# Patient Record
Sex: Female | Born: 1980 | Race: White | Hispanic: No | Marital: Married | Smoking: Never smoker
Health system: Southern US, Community
[De-identification: ages and names within clinical notes are randomized; demographics above are authoritative.]

## PROBLEM LIST (undated history)

## (undated) ENCOUNTER — Inpatient Hospital Stay (HOSPITAL_COMMUNITY): Payer: Self-pay

## (undated) DIAGNOSIS — R51 Headache: Secondary | ICD-10-CM

## (undated) DIAGNOSIS — Z6791 Unspecified blood type, Rh negative: Secondary | ICD-10-CM

## (undated) DIAGNOSIS — Z8619 Personal history of other infectious and parasitic diseases: Secondary | ICD-10-CM

## (undated) DIAGNOSIS — F419 Anxiety disorder, unspecified: Secondary | ICD-10-CM

## (undated) DIAGNOSIS — I8290 Acute embolism and thrombosis of unspecified vein: Secondary | ICD-10-CM

## (undated) DIAGNOSIS — R87629 Unspecified abnormal cytological findings in specimens from vagina: Secondary | ICD-10-CM

## (undated) DIAGNOSIS — E282 Polycystic ovarian syndrome: Secondary | ICD-10-CM

## (undated) DIAGNOSIS — O24419 Gestational diabetes mellitus in pregnancy, unspecified control: Secondary | ICD-10-CM

## (undated) DIAGNOSIS — O26899 Other specified pregnancy related conditions, unspecified trimester: Secondary | ICD-10-CM

## (undated) HISTORY — DX: Personal history of other infectious and parasitic diseases: Z86.19

## (undated) HISTORY — PX: OTHER SURGICAL HISTORY: SHX169

## (undated) HISTORY — DX: Unspecified abnormal cytological findings in specimens from vagina: R87.629

## (undated) HISTORY — DX: Anxiety disorder, unspecified: F41.9

## (undated) HISTORY — DX: Headache: R51

## (undated) HISTORY — DX: Acute embolism and thrombosis of unspecified vein: I82.90

## (undated) HISTORY — DX: Polycystic ovarian syndrome: E28.2

---

## 2001-03-12 DIAGNOSIS — Z8619 Personal history of other infectious and parasitic diseases: Secondary | ICD-10-CM

## 2001-03-12 HISTORY — DX: Personal history of other infectious and parasitic diseases: Z86.19

## 2001-03-12 HISTORY — PX: GYNECOLOGIC CRYOSURGERY: SHX857

## 2004-05-25 ENCOUNTER — Encounter: Admission: RE | Admit: 2004-05-25 | Discharge: 2004-05-25 | Payer: Self-pay | Admitting: Family Medicine

## 2011-01-12 ENCOUNTER — Ambulatory Visit (INDEPENDENT_AMBULATORY_CARE_PROVIDER_SITE_OTHER): Payer: Self-pay | Admitting: Obstetrics and Gynecology

## 2011-01-12 ENCOUNTER — Encounter: Payer: Self-pay | Admitting: Obstetrics and Gynecology

## 2011-01-12 VITALS — BP 129/93 | HR 96 | Temp 98.9°F | Ht 66.0 in | Wt 232.7 lb

## 2011-01-12 DIAGNOSIS — N898 Other specified noninflammatory disorders of vagina: Secondary | ICD-10-CM

## 2011-01-12 DIAGNOSIS — Z01812 Encounter for preprocedural laboratory examination: Secondary | ICD-10-CM

## 2011-01-12 DIAGNOSIS — N939 Abnormal uterine and vaginal bleeding, unspecified: Secondary | ICD-10-CM

## 2011-01-12 LAB — CBC
MCHC: 35.2 g/dL (ref 30.0–36.0)
Platelets: 269 10*3/uL (ref 150–400)
RDW: 12.9 % (ref 11.5–15.5)

## 2011-01-12 NOTE — Progress Notes (Signed)
S: Pt presents today c/o irregular vag bleeding since 08/2010. She states she thinks she had a miscarriage at that time. She had a positive home preg test but started to have bleeding and she states her "preg test went to negative" very quickly after the bleeding began. Since that time, she has had irregular menses. She is currently bleeding and she declines examination. She denies abd pain or any other sx. O: VSS afebrile A&Ox3 in NAD A/P: Irregular vag bleeding: pt agreed to have CBC and TSH drawn today. She will return in 2 months when she is not on her menses to have a yearly pap and PE. Discussed diet, activity, risks, and precautions.  Clinton Gallant. Kadan Millstein III, DrHSc, MPAS, PA-C

## 2011-01-24 ENCOUNTER — Telehealth: Payer: Self-pay | Admitting: *Deleted

## 2011-01-24 NOTE — Telephone Encounter (Signed)
Patient called today and left a message on voicemail requesting results  Of blood tests from 01/12/11 visit

## 2011-01-24 NOTE — Telephone Encounter (Signed)
Called patient and left a message we are returning your call- please call today or tomorrow during office hours( per chart review upt negative, tsh normal- should have scheduled annual for January

## 2011-01-24 NOTE — Telephone Encounter (Signed)
Also cbc wnl

## 2011-01-26 NOTE — Telephone Encounter (Signed)
Pt left additional message that she wants her test results and we may leave a message on her voice mail. I called back and left message of all her test results: UPT- neg, TSH-wnl, CBC-wnl. I also stated that she should call back to the appt scheduling line after Thanksgiving for an appt for annual GYN exam.

## 2011-03-02 ENCOUNTER — Ambulatory Visit: Payer: Self-pay | Admitting: Physician Assistant

## 2011-03-02 DIAGNOSIS — F411 Generalized anxiety disorder: Secondary | ICD-10-CM

## 2011-04-04 ENCOUNTER — Ambulatory Visit (INDEPENDENT_AMBULATORY_CARE_PROVIDER_SITE_OTHER): Payer: PRIVATE HEALTH INSURANCE | Admitting: Gynecology

## 2011-04-04 ENCOUNTER — Other Ambulatory Visit (HOSPITAL_COMMUNITY)
Admission: RE | Admit: 2011-04-04 | Discharge: 2011-04-04 | Disposition: A | Discharge status: Home / Self Care | Payer: PRIVATE HEALTH INSURANCE | Source: Ambulatory Visit | Attending: Gynecology | Admitting: Gynecology

## 2011-04-04 ENCOUNTER — Encounter: Payer: Self-pay | Admitting: Gynecology

## 2011-04-04 VITALS — BP 124/78 | Ht 67.0 in | Wt 232.0 lb

## 2011-04-04 DIAGNOSIS — N970 Female infertility associated with anovulation: Secondary | ICD-10-CM | POA: Insufficient documentation

## 2011-04-04 DIAGNOSIS — Z01419 Encounter for gynecological examination (general) (routine) without abnormal findings: Secondary | ICD-10-CM

## 2011-04-04 DIAGNOSIS — E663 Overweight: Secondary | ICD-10-CM

## 2011-04-04 DIAGNOSIS — N915 Oligomenorrhea, unspecified: Secondary | ICD-10-CM

## 2011-04-04 DIAGNOSIS — E282 Polycystic ovarian syndrome: Secondary | ICD-10-CM

## 2011-04-04 LAB — URINALYSIS W MICROSCOPIC + REFLEX CULTURE
Bilirubin Urine: NEGATIVE
Glucose, UA: NEGATIVE mg/dL
Urobilinogen, UA: 0.2 mg/dL (ref 0.0–1.0)

## 2011-04-04 MED ORDER — DOXYCYCLINE HYCLATE 50 MG PO CAPS: 100.0000 mg | ORAL_CAPSULE | Freq: Two times a day (BID) | ORAL | Status: AC

## 2011-04-04 NOTE — Patient Instructions (Addendum)
Please call Sherrilyn Rist at [phone] to schedule HSG at hospital and semen analysis when your period starts.Then will also need appointment to me 1 week after tests to discuss results. Take antibiotic twice a day starting the days prior to the HSG.    Infertility WHAT IS INFERTILITY?  Infertility is usually defined as not being able to get pregnant after trying for one year of regular sexual intercourse without the use of contraceptives. Or not being able to carry a pregnancy to term and have a baby. The infertility rate in the [place] is around 10%. Pregnancy is the result of a chain of events. A woman must release an egg from one of her ovaries (ovulation). The egg must be fertilized by the female sperm. Then it travels through a fallopian tube into the uterus (womb), where it attaches to the wall of the uterus and grows. A man must have enough sperm, and the sperm must join with (fertilize) the egg along the way, at the proper time. The fertilized egg must then become attached to the inside of the uterus. While this may seem simple, many things can happen to prevent pregnancy from occurring.  WHOSE PROBLEM IS IT?  About 20% of infertility cases are due to problems with the man (female factors) and 65% are due to problems with the woman (female factors). Other cases are due to a combination of female and female factors or to unknown causes.  WHAT CAUSES INFERTILITY IN MEN?  Infertility in men is often caused by problems with making enough normal sperm or getting the sperm to reach the egg. Problems with sperm may exist from birth or develop later in life, due to illness or injury. Some men produce no sperm, or produce too few sperm (oligospermia). Other problems include:  Sexual dysfunction.   Hormonal or endocrine problems.   Age. Female fertility decreases with age, but not at as young an age as female fertility.   Infection.   Congenital problems. Birth defect, such as absence of the tubes that carry  the sperm (vas deferens).   Genetic/chromosomal problems.   Antisperm antibody problems.   Retrograde ejaculation (sperm go into the bladder).   Varicoceles, spematoceles, or tumors of the testicles.   Lifestyle can influence the number and quality of a man's sperm.   Alcohol and drugs can temporarily reduce sperm quality.   Environmental toxins, including pesticides and lead, may cause some cases of infertility in men.  WHAT CAUSES INFERTILITY IN WOMEN?   Problems with ovulation account for most infertility in women. Without ovulation, eggs are not available to be fertilized.   Signs of problems with ovulation include irregular menstrual periods or no periods at all.   Simple lifestyle factors, including stress, diet, or athletic training, can affect a woman's hormonal balance.   Age. Fertility begins to decrease in women in the early 89s and is worse after age 43.   Much less often, a hormonal imbalance from a serious medical problem, such as a pituitary gland tumor, thyroid or other chronic medical disease, can cause ovulation problems.   Pelvic infections.   Polycystic ovary syndrome (increase in female hormones, unable to ovulate).   Alcohol or illegal drugs.   Environmental toxins, radiation, pesticides, and certain chemicals.   Aging is an important factor in female infertility.   The ability of a woman's ovaries to produce eggs declines with age, especially after age 19. About one third of couples where the woman is over 35 will have  problems with fertility.   By the time she reaches menopause when her monthly periods stop for good, a woman can no longer produce eggs or become pregnant.   Other problems can also lead to infertility in women. If the fallopian tubes are blocked at one or both ends, the egg cannot travel through the tubes into the uterus. Scar tissue (adhesions) in the pelvis may cause blocked tubes. This may result from pelvic inflammatory disease,  endometriosis, or surgery for an ectopic pregnancy (fertilized egg implanted outside the uterus) or any pelvic or abdominal surgery causing adhesions.   Fibroid tumors or polyps of the uterus.   Congenital (birth defect) abnormalities of the uterus.   Infection of the cervix (cervicitis).   Cervical stenosis (narrowing).   Abnormal cervical mucus.   Polycystic ovary syndrome.   Having sexual intercourse too often (every other day or 4 to 5 times a week).   Obesity.   Anorexia.   Poor nutrition.   Over exercising, with loss of body fat.   DES. Your mother received diethylstilbesterol hormone when pregnant with you.  HOW IS INFERTILITY TESTED?  If you have been trying to have a baby without success, you may want to seek medical help. You should not wait for one year of trying before seeing a health care provider if:  You are over 35.   You have reason to believe that there may be a fertility problem.  A medical evaluation may determine the reasons for a couple's infertility. Usually this process begins with:  Physical exams.   Medical histories of both partners.   Sexual histories of both partners.  If there is no obvious problem, like improperly timed intercourse or absence of ovulation, tests may be needed.   For a man, testing usually begins with tests of his semen to look at:   The number of sperm.   The shape of sperm.   Movement of his sperm.   Taking a complete medical and surgical history.   Physical examination.   Check for infection of the female reproductive organs.  Sometimes hormone tests are done.   For a woman, the first step in testing is to find out if she is ovulating each month. There are several ways to do this. For example, she can keep track of changes in her morning body temperature and in the texture of her cervical mucus. Another tool is a home ovulation test kit, which can be bought at drug or grocery stores.   Checks of ovulation can  also be done in the doctor's office, using blood tests for hormone levels or ultrasound tests of the ovaries. If the woman is ovulating, more tests will need to be done. Some common female tests include:   Hysterosalpingogram: An x-ray of the fallopian tubes and uterus after they are injected with dye. It shows if the tubes are open and shows the shape of the uterus.   Laparoscopy: An exam of the tubes and other female organs for disease. A lighted tube called a laparoscope is used to see inside the abdomen.   Endometrial biopsy: Sample of uterus tissue taken on the first day of the menstrual period, to see if the tissue indicates you are ovulating.   Transvaginal ultrasound: Examines the female organs.   Hysteroscopy: Uses a lighted tube to examine the cervix and inside the uterus, to see if there are any abnormalities inside the uterus.  TREATMENT  Depending on the test results, different treatments can be suggested.  The type of treatment depends on the cause. 85 to 90% of infertility cases are treated with drugs or surgery.   Various fertility drugs may be used for women with ovulation problems. It is important to talk with your caregiver about the drug to be used. You should understand the drug's benefits and side effects. Depending on the type of fertility drug and the dosage of the drug used, multiple births (twins or multiples) can occur in some women.   If needed, surgery can be done to repair damage to a woman's ovaries, fallopian tubes, cervix, or uterus.   Surgery or medical treatment for endometriosis or polycystic ovary syndrome. Sometimes a man has an infertility problem that can be corrected with medicine or by surgery.   Intrauterine insemination (IUI) of sperm, timed with ovulation.   Change in lifestyle, if that is the cause (lose weight, increase exercise, and stop smoking, drinking excessively, or taking illegal drugs).   Other types of surgery:   Removing growths inside  and on the uterus.   Removing scar tissue from inside of the uterus.   Fixing blocked tubes.   Removing scar tissue in the pelvis and around the female organs.  WHAT IS ASSISTED REPRODUCTIVE TECHNOLOGY (ART)?  Assisted reproductive technology (ART) is another form of special methods used to help infertile couples. ART involves handling both the woman's eggs and the man's sperm. Success rates vary and depend on many factors. ART can be expensive and time-consuming. But ART has made it possible for many couples to have children that otherwise would not have been conceived. Some methods are listed below:  In vitro fertilization (IVF). IVF is often used when a woman's fallopian tubes are blocked or when a man has low sperm counts. A drug is used to stimulate the ovaries to produce multiple eggs. Once mature, the eggs are removed and placed in a culture dish with the man's sperm for fertilization. After about 40 hours, the eggs are examined to see if they have become fertilized by the sperm and are dividing into cells. These fertilized eggs (embryos) are then placed in the woman's uterus. This bypasses the fallopian tubes.   Gamete intrafallopian transfer (GIFT) is similar to IVF, but used when the woman has at least one normal fallopian tube. Three to five eggs are placed in the fallopian tube, along with the man's sperm, for fertilization inside the woman's body.   Zygote intrafallopian transfer (ZIFT), also called tubal embryo transfer, combines IVF and GIFT. The eggs retrieved from the woman's ovaries are fertilized in the lab and placed in the fallopian tubes rather than in the uterus.   ART procedures sometimes involve the use of donor eggs (eggs from another woman) or previously frozen embryos. Donor eggs may be used if a woman has impaired ovaries or has a genetic disease that could be passed on to her baby.   When performing ART, you are at higher risk for resulting in multiple pregnancies,  twins, triplets or more.   Intracytoplasma sperm injection is a procedure that injects a single sperm into the egg to fertilize it.   Embryo transplant is a procedure that starts after growing an embryo in a special media (chemical solution) developed to keep the embryo alive for 2 to 5 days, and then transplanting it into the uterus.  In cases where a cause cannot be found and pregnancy does not occur, adoption may be a consideration. Document Released: 03/01/2003 Document Revised: 11/08/2010 Document Reviewed: 01/25/2009 Exit[employer] Patient  Information 2012 [place], [place].

## 2011-04-04 NOTE — Progress Notes (Signed)
Kerri [name] 01-10-1981 [id number]   History:    31 y.o.  for annual exam new patient to the practice. Patient is a gravida 1 para 0 AB 1 (chemical pregnancy?). Patient with history of infertility. Patient cycles range from 40-45 days. She has also had irregular bleeding as a result of her oligomenorrhea. She is overweight and currently weighs 232 pounds with a BMI of 36.34. Patient stated that she had a TSH and CBC done in November which was normal she had been followed by another provider here in the community. Records were reviewed. She has a history of abnormal Pap smear 2003 treated with cryotherapy. She suffers from varicose veins and there was question whether at the age of 46 she had a true DVT or not because on questioning she was never placed on any anticoagulation and was instructed to take an aspirin daily. Several years after that she had been on oral contraceptive pills for close to 15 years with no sequelae reported. Her followup Pap smears have been normal except 2 years ago she had an ASCUS negative HPV with followup Pap smear being negative. At her primary care physician office she had been placed on Celexa and Klonopin for depression and anxiety. Patient in frequently does her self breast examination.  Past medical history,surgical history, family history and social history were all reviewed and documented in the EPIC chart.  Gynecologic History Patient's last menstrual period was 02/10/2011. Contraception: none Last Pap: 2011. Results were: Normal Last mammogram: No prior study. Res not indicated/abn:[id number]}  Obstetric History OB History    Grav Para Term Preterm Abortions TAB SAB Ect Mult Living   1 0 0 0 1  1 0 0 0     # Outc Date GA Lbr Len/2nd Wgt Sex Del Anes PTL Lv   1 SAB                ROS:  Was performed and pertinent positives and negatives are included in the history.  Exam: chaperone present  BP 124/78  Ht 5\' 7"  (1.702 m)  Wt 232 lb (105.235 kg)  BMI 36.34  kg/m2  LMP 02/10/2011  Body mass index is 36.34 kg/(m^2).  General appearance : Well developed well nourished female. No acute distress HEENT: Neck supple, trachea midline, no carotid bruits, no thyroidmegaly Lungs: Clear to auscultation, no rhonchi or wheezes, or rib retractions  Heart: Regular rate and rhythm, no murmurs or gallops Breast:Examined in sitting and supine position were symmetrical in appearance, no palpable masses or tenderness,  no skin retraction, no nipple inversion, no nipple discharge, no skin discoloration, no axillary or supraclavicular lymphadenopathy Abdomen: no palpable masses or tenderness, no rebound or guarding Extremities: no edema or skin discoloration or tenderness  Pelvic:  Bartholin, Urethra, Skene Glands: Within normal limits             Vagina: No gross lesions or discharge  Cervix: No gross lesions or discharge  Uterus  difficult to assess due to patient's abdominal girth and vaginismus  Adnexa  difficult to assess due to patient's abdominal girth and vaginismus  Anus and perineum  normal   Rectovaginal  normal sphincter tone without palpated masses or tenderness             Hemoccult not done     Assessment/Plan:  31 y.o. 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 Patient with PCO S.-like appearance (overweight, oligomenorrhea, and constitutional hirsutism). Due to the difficult nature  of her pelvic exam today she will return back to the office next week for an ultrasound to better assess her uterus and adnexa. When she does come in next week for the ultrasound she will come in a fasting state so that we can do a fasting blood sugar along with fasting insulin and prolactin as well as DHEA, total testosterone, and 17 hydroxyprogesterone . When her period starts in a few weeks she will contact the office so that we can schedule an HSG and semen analysis. We have called in to her pharmacy the  Vibramycin for her to take prophylactically twice a day starting with the day before the procedure. We'll then have her return to the office a week after to discuss all the results and possibly consider ovulation induction medication. Literature information was provided today of infertility. She will continue her prenatal vitamins and was instructed that she start tapering off her Celexa and Klonopin. Pap smear was done today. All questions were answered we'll follow accordingly.    Ok Edwards MD, 4:27 PM 04/04/2011

## 2011-04-11 ENCOUNTER — Encounter: Payer: Self-pay | Admitting: *Deleted

## 2011-04-11 ENCOUNTER — Encounter: Payer: Self-pay | Admitting: Gynecology

## 2011-04-11 ENCOUNTER — Ambulatory Visit (INDEPENDENT_AMBULATORY_CARE_PROVIDER_SITE_OTHER): Payer: PRIVATE HEALTH INSURANCE | Admitting: Gynecology

## 2011-04-11 ENCOUNTER — Ambulatory Visit (INDEPENDENT_AMBULATORY_CARE_PROVIDER_SITE_OTHER): Payer: PRIVATE HEALTH INSURANCE

## 2011-04-11 VITALS — BP 128/82

## 2011-04-11 DIAGNOSIS — N920 Excessive and frequent menstruation with regular cycle: Secondary | ICD-10-CM

## 2011-04-11 DIAGNOSIS — E282 Polycystic ovarian syndrome: Secondary | ICD-10-CM

## 2011-04-11 DIAGNOSIS — N912 Amenorrhea, unspecified: Secondary | ICD-10-CM

## 2011-04-11 DIAGNOSIS — N970 Female infertility associated with anovulation: Secondary | ICD-10-CM

## 2011-04-11 DIAGNOSIS — N8300 Follicular cyst of ovary, unspecified side: Secondary | ICD-10-CM

## 2011-04-11 DIAGNOSIS — E663 Overweight: Secondary | ICD-10-CM

## 2011-04-11 DIAGNOSIS — N915 Oligomenorrhea, unspecified: Secondary | ICD-10-CM

## 2011-04-11 LAB — PREGNANCY, URINE: Preg Test, Ur: NEGATIVE

## 2011-04-11 MED ORDER — MEDROXYPROGESTERONE ACETATE 10 MG PO TABS: 10.0000 mg | ORAL_TABLET | Freq: Every day | ORAL | Status: DC

## 2011-04-11 NOTE — Patient Instructions (Addendum)
Please make sure you return in a fasting state to get your blood drawn. Take the provera starting today for 5-10 days to start your period. When your period starts please call the office at [phone] and go into Jennifer's voice mail so she can schedule your HSG. Remember to start the antibiotic the day prior to procedure. I will need to see you in consultation 1 week after HSG to discuss all the results and plan a course of management. Here is information to  Help you loose weight and stop smoking:  Smoking Cessation This document explains the best ways for you to quit smoking and new treatments to help. It lists new medicines that can double or triple your chances of quitting and quitting for good. It also considers ways to avoid relapses and concerns you may have about quitting, including weight gain. NICOTINE: A POWERFUL ADDICTION If you have tried to quit smoking, you know how hard it can be. It is hard because nicotine is a very addictive drug. For some people, it can be as addictive as heroin or cocaine. Usually, people make 2 or 3 tries, or more, before finally being able to quit. Each time you try to quit, you can learn about what helps and what hurts. Quitting takes hard work and a lot of effort, but you can quit smoking. QUITTING SMOKING IS ONE OF THE MOST IMPORTANT THINGS YOU WILL EVER DO.  You will live longer, feel better, and live better.   The impact on your body of quitting smoking is felt almost immediately:   Within 20 minutes, blood pressure decreases. Pulse returns to its normal level.   After 8 hours, carbon monoxide levels in the blood return to normal. Oxygen level increases.   After 24 hours, chance of heart attack starts to decrease. Breath, hair, and body stop smelling like smoke.   After 48 hours, damaged nerve endings begin to recover. Sense of taste and smell improve.   After 72 hours, the body is virtually free of nicotine. Bronchial tubes relax and breathing becomes  easier.   After 2 to 12 weeks, lungs can hold more air. Exercise becomes easier and circulation improves.   Quitting will reduce your risk of having a heart attack, stroke, cancer, or lung disease:   After 1 year, the risk of coronary heart disease is cut in half.   After 5 years, the risk of stroke falls to the same as a nonsmoker.   After 10 years, the risk of lung cancer is cut in half and the risk of other cancers decreases significantly.   After 15 years, the risk of coronary heart disease drops, usually to the level of a nonsmoker.   If you are pregnant, quitting smoking will improve your chances of having a healthy baby.   The people you live with, especially your children, will be healthier.   You will have extra money to spend on things other than cigarettes.  FIVE KEYS TO QUITTING Studies have shown that these 5 steps will help you quit smoking and quit for good. You have the best chances of quitting if you use them together: 1. Get ready.  2. Get support and encouragement.  3. Learn new skills and behaviors.  4. Get medicine to reduce your nicotine addiction and use it correctly.  5. Be prepared for relapse or difficult situations. Be determined to continue trying to quit, even if you do not succeed at first.  1. GET READY  Set a quit  date.   Change your environment.   Get rid of ALL cigarettes, ashtrays, matches, and lighters in your home, car, and place of work.   Do not let people smoke in your home.   Review your past attempts to quit. Think about what worked and what did not.   Once you quit, do not smoke. NOT EVEN A PUFF!  2. GET SUPPORT AND ENCOURAGEMENT Studies have shown that you have a better chance of being successful if you have help. You can get support in many ways.  Tell your family, friends, and coworkers that you are going to quit and need their support. Ask them not to smoke around you.   Talk to your caregivers (doctor, dentist, nurse,  pharmacist, psychologist, and/or smoking counselor).   Get individual, group, or telephone counseling and support. The more counseling you have, the better your chances are of quitting. Programs are available at [employer] and health centers. Call your local health department for information about programs in your area.   Spiritual beliefs and practices may help some smokers quit.   Quit meters are Photographer that keep track of quit statistics, such as amount of "quit-time," cigarettes not smoked, and money saved.   Many smokers find one or more of the many self-help books available useful in helping them quit and stay off tobacco.  3. LEARN NEW SKILLS AND BEHAVIORS  Try to distract yourself from urges to smoke. Talk to someone, go for a walk, or occupy your time with a task.   When you first try to quit, change your routine. Take a different route to work. Drink tea instead of coffee. Eat breakfast in a different place.   Do something to reduce your stress. Take a hot bath, exercise, or read a book.   Plan something enjoyable to do every day. Reward yourself for not smoking.   Explore interactive web-based programs that specialize in helping you quit.  4. GET MEDICINE AND USE IT CORRECTLY Medicines can help you stop smoking and decrease the urge to smoke. Combining medicine with the above behavioral methods and support can quadruple your chances of successfully quitting smoking. The [place] Food and Drug Administration (FDA) has approved 7 medicines to help you quit smoking. These medicines fall into 3 categories.  Nicotine replacement therapy (delivers nicotine to your body without the negative effects and risks of smoking):   Nicotine gum: Available over-the-counter.   Nicotine lozenges: Available over-the-counter.   Nicotine inhaler: Available by prescription.   Nicotine nasal spray: Available by prescription.   Nicotine skin patches  (transdermal): Available by prescription and over-the-counter.   Antidepressant medicine (helps people abstain from smoking, but how this works is unknown):   Bupropion sustained-release (SR) tablets: Available by prescription.   Nicotinic receptor partial agonist (simulates the effect of nicotine in your brain):   Varenicline tartrate tablets: Available by prescription.   Ask your caregiver for advice about which medicines to use and how to use them. Carefully read the information on the package.   Everyone who is trying to quit may benefit from using a medicine. If you are pregnant or trying to become pregnant, nursing an infant, you are under age 16, or you smoke fewer than 10 cigarettes per day, talk to your caregiver before taking any nicotine replacement medicines.   You should stop using a nicotine replacement product and call your caregiver if you experience nausea, dizziness, weakness, vomiting, fast or irregular heartbeat, mouth problems with  the lozenge or gum, or redness or swelling of the skin around the patch that does not go away.   Do not use any other product containing nicotine while using a nicotine replacement product.   Talk to your caregiver before using these products if you have diabetes, heart disease, asthma, stomach ulcers, you had a recent heart attack, you have high blood pressure that is not controlled with medicine, a history of irregular heartbeat, or you have been prescribed medicine to help you quit smoking.  5. BE PREPARED FOR RELAPSE OR DIFFICULT SITUATIONS  Most relapses occur within the first 3 months after quitting. Do not be discouraged if you start smoking again. Remember, most people try several times before they finally quit.   You may have symptoms of withdrawal because your body is used to nicotine. You may crave cigarettes, be irritable, feel very hungry, cough often, get headaches, or have difficulty concentrating.   The withdrawal symptoms are  only temporary. They are strongest when you first quit, but they will go away within 10 to 14 days.  Here are some difficult situations to watch for:  Alcohol. Avoid drinking alcohol. Drinking lowers your chances of successfully quitting.   Caffeine. Try to reduce the amount of caffeine you consume. It also lowers your chances of successfully quitting.   Other smokers. Being around smoking can make you want to smoke. Avoid smokers.   Weight gain. Many smokers will gain weight when they quit, usually less than 10 pounds. Eat a healthy diet and stay active. Do not let weight gain distract you from your main goal, quitting smoking. Some medicines that help you quit smoking may also help delay weight gain. You can always lose the weight gained after you quit.   Bad mood or depression. There are a lot of ways to improve your mood other than smoking.  If you are having problems with any of these situations, talk to your caregiver. SPECIAL SITUATIONS AND CONDITIONS Studies suggest that everyone can quit smoking. Your situation or condition can give you a special reason to quit.  Pregnant women/new mothers: By quitting, you protect your baby's health and your own.   Hospitalized patients: By quitting, you reduce health problems and help healing.   Heart attack patients: By quitting, you reduce your risk of a second heart attack.   Lung, head, and neck cancer patients: By quitting, you reduce your chance of a second cancer.   Parents of children and adolescents: By quitting, you protect your children from illnesses caused by secondhand smoke.  QUESTIONS TO THINK ABOUT Think about the following questions before you try to stop smoking. You may want to talk about your answers with your caregiver.  Why do you want to quit?   If you tried to quit in the past, what helped and what did not?   What will be the most difficult situations for you after you quit? How will you plan to handle them?   Who  can help you through the tough times? Your family? Friends? Caregiver?   What pleasures do you get from smoking? What ways can you still get pleasure if you quit?  Here are some questions to ask your caregiver:  How can you help me to be successful at quitting?   What medicine do you think would be best for me and how should I take it?   What should I do if I need more help?   What is smoking withdrawal like? How can  I get information on withdrawal?  Quitting takes hard work and a lot of effort, but you can quit smoking. FOR MORE INFORMATION  Smokefree.gov (http://www.davis-sullivan.com/) provides free, accurate, evidence-based information and professional assistance to help support the immediate and long-term needs of people trying to quit smoking. Document Released: 02/20/2001 Document Revised: 11/08/2010 Document Reviewed: 12/13/2008 [facility] Patient Information 2012 [place], [place].  Exercise to Lose Weight Exercise and a healthy diet may help you lose weight. Your doctor may suggest specific exercises. EXERCISE IDEAS AND TIPS  Choose low-cost things you enjoy doing, such as walking, bicycling, or exercising to workout videos.   Take stairs instead of the elevator.   Walk during your lunch break.   Park your car further away from work or school.   Go to a gym or an exercise class.   Start with 5 to 10 minutes of exercise each day. Build up to 30 minutes of exercise 4 to 6 days a week.   Wear shoes with good support and comfortable clothes.   Stretch before and after working out.   Work out until you breathe harder and your heart beats faster.   Drink extra water when you exercise.   Do not do so much that you hurt yourself, feel dizzy, or get very short of breath.  Exercises that burn about 150 calories:  Running 1  miles in 15 minutes.   Playing volleyball for 45 to 60 minutes.   Washing and waxing a car for 45 to 60 minutes.   Playing touch football for 45 minutes.    Walking 1  miles in 35 minutes.   Pushing a stroller 1  miles in 30 minutes.   Playing basketball for 30 minutes.   Raking leaves for 30 minutes.   Bicycling 5 miles in 30 minutes.   Walking 2 miles in 30 minutes.   Dancing for 30 minutes.   Shoveling snow for 15 minutes.   Swimming laps for 20 minutes.   Walking up stairs for 15 minutes.   Bicycling 4 miles in 15 minutes.   Gardening for 30 to 45 minutes.   Jumping rope for 15 minutes.   Washing windows or floors for 45 to 60 minutes.  Document Released: 03/31/2010 Document Revised: 11/08/2010 Document Reviewed: 03/31/2010 [facility] Patient Information 2012 [place], [place].   Infertility WHAT IS INFERTILITY?  Infertility is usually defined as not being able to get pregnant after trying for one year of regular sexual intercourse without the use of contraceptives. Or not being able to carry a pregnancy to term and have a baby. The infertility rate in the [place] is around 10%. Pregnancy is the result of a chain of events. A woman must release an egg from one of her ovaries (ovulation). The egg must be fertilized by the female sperm. Then it travels through a fallopian tube into the uterus (womb), where it attaches to the wall of the uterus and grows. A man must have enough sperm, and the sperm must join with (fertilize) the egg along the way, at the proper time. The fertilized egg must then become attached to the inside of the uterus. While this may seem simple, many things can happen to prevent pregnancy from occurring.  WHOSE PROBLEM IS IT?  About 20% of infertility cases are due to problems with the man (female factors) and 65% are due to problems with the woman (female factors). Other cases are due to a combination of female and female factors or to unknown causes.  WHAT CAUSES INFERTILITY IN MEN?  Infertility in men is often caused by problems with making enough normal sperm or getting the sperm to reach the egg.  Problems with sperm may exist from birth or develop later in life, due to illness or injury. Some men produce no sperm, or produce too few sperm (oligospermia). Other problems include:  Sexual dysfunction.   Hormonal or endocrine problems.   Age. Female fertility decreases with age, but not at as young an age as female fertility.   Infection.   Congenital problems. Birth defect, such as absence of the tubes that carry the sperm (vas deferens).   Genetic/chromosomal problems.   Antisperm antibody problems.   Retrograde ejaculation (sperm go into the bladder).   Varicoceles, spematoceles, or tumors of the testicles.   Lifestyle can influence the number and quality of a man's sperm.   Alcohol and drugs can temporarily reduce sperm quality.   Environmental toxins, including pesticides and lead, may cause some cases of infertility in men.  WHAT CAUSES INFERTILITY IN WOMEN?   Problems with ovulation account for most infertility in women. Without ovulation, eggs are not available to be fertilized.   Signs of problems with ovulation include irregular menstrual periods or no periods at all.   Simple lifestyle factors, including stress, diet, or athletic training, can affect a woman's hormonal balance.   Age. Fertility begins to decrease in women in the early 87s and is worse after age 86.   Much less often, a hormonal imbalance from a serious medical problem, such as a pituitary gland tumor, thyroid or other chronic medical disease, can cause ovulation problems.   Pelvic infections.   Polycystic ovary syndrome (increase in female hormones, unable to ovulate).   Alcohol or illegal drugs.   Environmental toxins, radiation, pesticides, and certain chemicals.   Aging is an important factor in female infertility.   The ability of a woman's ovaries to produce eggs declines with age, especially after age 32. About one third of couples where the woman is over 35 will have problems with  fertility.   By the time she reaches menopause when her monthly periods stop for good, a woman can no longer produce eggs or become pregnant.   Other problems can also lead to infertility in women. If the fallopian tubes are blocked at one or both ends, the egg cannot travel through the tubes into the uterus. Scar tissue (adhesions) in the pelvis may cause blocked tubes. This may result from pelvic inflammatory disease, endometriosis, or surgery for an ectopic pregnancy (fertilized egg implanted outside the uterus) or any pelvic or abdominal surgery causing adhesions.   Fibroid tumors or polyps of the uterus.   Congenital (birth defect) abnormalities of the uterus.   Infection of the cervix (cervicitis).   Cervical stenosis (narrowing).   Abnormal cervical mucus.   Polycystic ovary syndrome.   Having sexual intercourse too often (every other day or 4 to 5 times a week).   Obesity.   Anorexia.   Poor nutrition.   Over exercising, with loss of body fat.   DES. Your mother received diethylstilbesterol hormone when pregnant with you.  HOW IS INFERTILITY TESTED?  If you have been trying to have a baby without success, you may want to seek medical help. You should not wait for one year of trying before seeing a health care provider if:  You are over 35.   You have reason to believe that there may be a fertility problem.  A  medical evaluation may determine the reasons for a couple's infertility. Usually this process begins with:  Physical exams.   Medical histories of both partners.   Sexual histories of both partners.  If there is no obvious problem, like improperly timed intercourse or absence of ovulation, tests may be needed.   For a man, testing usually begins with tests of his semen to look at:   The number of sperm.   The shape of sperm.   Movement of his sperm.   Taking a complete medical and surgical history.   Physical examination.   Check for infection of  the female reproductive organs.  Sometimes hormone tests are done.   For a woman, the first step in testing is to find out if she is ovulating each month. There are several ways to do this. For example, she can keep track of changes in her morning body temperature and in the texture of her cervical mucus. Another tool is a home ovulation test kit, which can be bought at drug or grocery stores.   Checks of ovulation can also be done in the doctor's office, using blood tests for hormone levels or ultrasound tests of the ovaries. If the woman is ovulating, more tests will need to be done. Some common female tests include:   Hysterosalpingogram: An x-ray of the fallopian tubes and uterus after they are injected with dye. It shows if the tubes are open and shows the shape of the uterus.   Laparoscopy: An exam of the tubes and other female organs for disease. A lighted tube called a laparoscope is used to see inside the abdomen.   Endometrial biopsy: Sample of uterus tissue taken on the first day of the menstrual period, to see if the tissue indicates you are ovulating.   Transvaginal ultrasound: Examines the female organs.   Hysteroscopy: Uses a lighted tube to examine the cervix and inside the uterus, to see if there are any abnormalities inside the uterus.  TREATMENT  Depending on the test results, different treatments can be suggested. The type of treatment depends on the cause. 85 to 90% of infertility cases are treated with drugs or surgery.   Various fertility drugs may be used for women with ovulation problems. It is important to talk with your caregiver about the drug to be used. You should understand the drug's benefits and side effects. Depending on the type of fertility drug and the dosage of the drug used, multiple births (twins or multiples) can occur in some women.   If needed, surgery can be done to repair damage to a woman's ovaries, fallopian tubes, cervix, or uterus.   Surgery or  medical treatment for endometriosis or polycystic ovary syndrome. Sometimes a man has an infertility problem that can be corrected with medicine or by surgery.   Intrauterine insemination (IUI) of sperm, timed with ovulation.   Change in lifestyle, if that is the cause (lose weight, increase exercise, and stop smoking, drinking excessively, or taking illegal drugs).   Other types of surgery:   Removing growths inside and on the uterus.   Removing scar tissue from inside of the uterus.   Fixing blocked tubes.   Removing scar tissue in the pelvis and around the female organs.  WHAT IS ASSISTED REPRODUCTIVE TECHNOLOGY (ART)?  Assisted reproductive technology (ART) is another form of special methods used to help infertile couples. ART involves handling both the woman's eggs and the man's sperm. Success rates vary and depend on many factors. ART  can be expensive and time-consuming. But ART has made it possible for many couples to have children that otherwise would not have been conceived. Some methods are listed below:  In vitro fertilization (IVF). IVF is often used when a woman's fallopian tubes are blocked or when a man has low sperm counts. A drug is used to stimulate the ovaries to produce multiple eggs. Once mature, the eggs are removed and placed in a culture dish with the man's sperm for fertilization. After about 40 hours, the eggs are examined to see if they have become fertilized by the sperm and are dividing into cells. These fertilized eggs (embryos) are then placed in the woman's uterus. This bypasses the fallopian tubes.   Gamete intrafallopian transfer (GIFT) is similar to IVF, but used when the woman has at least one normal fallopian tube. Three to five eggs are placed in the fallopian tube, along with the man's sperm, for fertilization inside the woman's body.   Zygote intrafallopian transfer (ZIFT), also called tubal embryo transfer, combines IVF and GIFT. The eggs retrieved from  the woman's ovaries are fertilized in the lab and placed in the fallopian tubes rather than in the uterus.   ART procedures sometimes involve the use of donor eggs (eggs from another woman) or previously frozen embryos. Donor eggs may be used if a woman has impaired ovaries or has a genetic disease that could be passed on to her baby.   When performing ART, you are at higher risk for resulting in multiple pregnancies, twins, triplets or more.   Intracytoplasma sperm injection is a procedure that injects a single sperm into the egg to fertilize it.   Embryo transplant is a procedure that starts after growing an embryo in a special media (chemical solution) developed to keep the embryo alive for 2 to 5 days, and then transplanting it into the uterus.  In cases where a cause cannot be found and pregnancy does not occur, adoption may be a consideration. Document Released: 03/01/2003 Document Revised: 11/08/2010 Document Reviewed: 01/25/2009 [facility] Patient Information 2012 [place], [place].

## 2011-04-11 NOTE — Progress Notes (Signed)
Patient is a 31 year old who presented to the office today for planned lab tests and ultrasound. 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 She does state that she had a positive pregnancy test a few months ago but then had her normal menstrual period. (Chemical pregnancy?). She states her cycles are every 40-45 days in escape months as well. She is overweight and has a BMI of 36.34. She also smokes. She brought to my attention today that she would like to hold off on her lab work due to the fact that she was pardon last night and drank heavily. She was scheduled to have the following lab tests:  DHEAS, 17 hydroxyprogesterone, total testosterone, fasting insulin, and fasting blood sugar and prolactin. She had a recent TSH which was normal at another facility. We'll have patient return back next week in a fasting state to get these blood tests drawn.  We did a urine precipitous today which was negative. Her last menstrual period was 02/10/2011. A prescription for Provera 10 mg to take 1 by mouth daily for 5-10 days to start her menses. She will then contact the office to schedule the HSG as well as her partner semen analysis. Instructions as well as prescriptions to include Vibramycin for prophylaxis prior to HSG was provided. She will then make an appointment to see a week after all the above studies are completed dissected down and discuss course of management. Today's ultrasound as follows:  Uterus measured 8 x 5.3 x 4.5 cm endometrial stripe 9.1 mm. Nabothian cyst seen right ovary and left ovary were normal although increase follicle bilateral ovary in the parameter of the wall "black probe necklace" appearance.  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 She needs to stop smoking also and engage in a regular exercise program and proper nutrition for which  literature information was provided. All questions are answered and we'll follow accordingly.

## 2011-04-11 NOTE — Progress Notes (Signed)
Patient ID: Kerri [name], female   DOB: 09-Aug-1980, 31 y.o.   MRN: [id number] Pharmacy called to clarify Rx for vibramycin 50 mg should be 1 tab 2 times daily for 3 days.

## 2011-04-28 ENCOUNTER — Encounter (HOSPITAL_COMMUNITY): Payer: Self-pay | Admitting: *Deleted

## 2011-04-28 ENCOUNTER — Emergency Department (HOSPITAL_COMMUNITY)
Admission: EM | Admit: 2011-04-28 | Discharge: 2011-04-28 | Disposition: A | Discharge status: Home / Self Care | Payer: PRIVATE HEALTH INSURANCE | Source: Home / Self Care | Attending: Emergency Medicine | Admitting: Emergency Medicine

## 2011-04-28 DIAGNOSIS — Z23 Encounter for immunization: Secondary | ICD-10-CM

## 2011-04-28 DIAGNOSIS — IMO0002 Reserved for concepts with insufficient information to code with codable children: Secondary | ICD-10-CM

## 2011-04-28 DIAGNOSIS — S60511A Abrasion of right hand, initial encounter: Secondary | ICD-10-CM

## 2011-04-28 MED ORDER — CEPHALEXIN 500 MG PO CAPS: 500.0000 mg | ORAL_CAPSULE | Freq: Three times a day (TID) | ORAL | Status: AC

## 2011-04-28 MED ORDER — TETANUS-DIPHTH-ACELL PERTUSSIS 5-2.5-18.5 LF-MCG/0.5 IM SUSP
0.5000 mL | Freq: Once | INTRAMUSCULAR | Status: AC
  Administered 2011-04-28: 0.5 mL via INTRAMUSCULAR

## 2011-04-28 MED ORDER — TETANUS-DIPHTH-ACELL PERTUSSIS 5-2.5-18.5 LF-MCG/0.5 IM SUSP
INTRAMUSCULAR | Status: AC
  Filled 2011-04-28: qty 0.5

## 2011-04-28 NOTE — ED Provider Notes (Signed)
Chief Complaint  Patient presents with  . Hand Injury    History of Present Illness:   Kerri [name] is a 31 year old female who scratched her right hand last night at work on a [employer], contracting. Her last tetanus shot was about 20 years ago. She has a very shallow laceration dorsum of her right first metacarpal area. There is a little surrounding erythema, but no purulent drainage, pain, or pain on movement of the thumb.  Review of Systems:  Other than noted above, the patient denies any of the following symptoms: Systemic:  No fevers, chills, sweats, or aches.  No fatigue or tiredness. Musculoskeletal:  No joint pain, arthritis, bursitis, swelling, back pain, or neck pain. Neurological:  No muscular weakness, paresthesias, headache, or trouble with speech or coordination.  No dizziness.   PMFSH:  Past medical history, family history, social history, meds, and allergies were reviewed.  Physical Exam:   Vital signs:  BP 121/83  Pulse 86  Temp(Src) 98.6 F (37 C) (Oral)  Resp 20  SpO2 99%  LMP 04/18/2011 Gen:  Alert and oriented times 3.  In no distress. Musculoskeletal: She has a shallow scratch on the dorsum of the first metacarpal of the right hand. There's a little bit of surrounding erythema but no pain to palpation and no purulent drainage. The MCP and IP joint of the thumb have full range of motion. Otherwise, all joints had a full a ROM with no swelling, bruising or deformity.  No edema, pulses full. Extremities were warm and pink.  Capillary refill was brisk.  Skin:  Clear, warm and dry.  No rash. Neuro:  Alert and oriented times 3.  Muscle strength was normal.  Sensation was intact to light touch.   Radiology:  No results found.  Assessment:   Diagnoses that have been ruled out:  None  Diagnoses that are still under consideration:  None  Final diagnoses:  Abrasion of right hand    Plan:   1.  The following meds were prescribed:   New Prescriptions   CEPHALEXIN (KEFLEX) 500 MG  CAPSULE    Take 1 capsule (500 mg total) by mouth 3 (three) times daily.   CEPHALEXIN (KEFLEX) 500 MG CAPSULE    Take 1 capsule (500 mg total) by mouth 3 (three) times daily.   2.  The patient was instructed in symptomatic care, including rest and activity, elevation, application of ice and compression.  Appropriate handouts were given. 3.  The patient was told to return if becoming worse in any way, if no better in 3 or 4 days, and given some red flag symptoms that would indicate earlier return.   4.  The patient was told to follow up if this gets any worse or shows any signs of getting infected.  The patient was told not to get a prescription for cephalexin filled unless there were signs of worse infection with swelling, or redness, or pain.    Roque Lias, MD 04/28/11 2009

## 2011-04-28 NOTE — Discharge Instructions (Signed)
Abrasions  Abrasions are skin scrapes. Their treatment depends on how large and deep the abrasion is. Abrasions do not extend through all layers of the skin. A cut or lesion through all skin layers is called a laceration.  HOME CARE INSTRUCTIONS   · If you were given a dressing, change it at least once a day or as instructed by your caregiver. If the bandage sticks, soak it off with a solution of water or hydrogen peroxide.   · Twice a day, wash the area with soap and water to remove all the cream/ointment. You may do this in a sink, under a tub faucet, or in a shower. Rinse off the soap and pat dry with a clean towel. Look for signs of infection (see below).   · Reapply cream/ointment according to your caregiver's instruction. This will help prevent infection and keep the bandage from sticking. Telfa or gauze over the wound and under the dressing or wrap will also help keep the bandage from sticking.   · If the bandage becomes wet, dirty, or develops a foul smell, change it as soon as possible.   · Only take over-the-counter or prescription medicines for pain, discomfort, or fever as directed by your caregiver.   SEEK IMMEDIATE MEDICAL CARE IF:   · Increasing pain in the wound.   · Signs of infection develop: redness, swelling, surrounding area is tender to touch, or pus coming from the wound.   · You have a fever.   · Any foul smell coming from the wound or dressing.   Most skin wounds heal within ten days. Facial wounds heal faster. However, an infection may occur despite proper treatment. You should have the wound checked for signs of infection within 24 to 48 hours or sooner if problems arise. If you were not given a wound-check appointment, look closely at the wound yourself on the second day for early signs of infection listed above.  MAKE SURE YOU:   · Understand these instructions.   · Will watch your condition.   · Will get help right away if you are not doing well or get worse.   Document Released:  12/06/2004 Document Revised: 11/08/2010 Document Reviewed: 10/15/2007  [employer]® Patient Information ©2012 [employer].

## 2011-04-28 NOTE — ED Notes (Signed)
Pt with abrasion right hand onset of injury last night - scraped on rusty dumptster pt last tetnus approx 20 years ago

## 2011-06-13 ENCOUNTER — Other Ambulatory Visit: Payer: Self-pay | Admitting: Gynecology

## 2011-06-13 ENCOUNTER — Telehealth: Payer: Self-pay | Admitting: *Deleted

## 2011-06-13 DIAGNOSIS — N979 Female infertility, unspecified: Secondary | ICD-10-CM

## 2011-06-13 NOTE — Telephone Encounter (Signed)
Pt called to schedule HSG appointment scheduled on 06/14/11 8:00 a.m. Pt informed with this as well. Pt would like to come in later day next week Wednesday or Thursday, explained to pt that that time frame the test could not be done. [facility].

## 2011-06-14 ENCOUNTER — Encounter: Payer: Self-pay | Admitting: *Deleted

## 2011-06-14 ENCOUNTER — Ambulatory Visit (HOSPITAL_COMMUNITY): Admission: RE | Admit: 2011-06-14 | Payer: PRIVATE HEALTH INSURANCE | Source: Ambulatory Visit

## 2011-06-14 ENCOUNTER — Other Ambulatory Visit: Payer: PRIVATE HEALTH INSURANCE

## 2011-06-14 DIAGNOSIS — N912 Amenorrhea, unspecified: Secondary | ICD-10-CM

## 2011-06-14 DIAGNOSIS — N970 Female infertility associated with anovulation: Secondary | ICD-10-CM

## 2011-06-14 DIAGNOSIS — N979 Female infertility, unspecified: Secondary | ICD-10-CM

## 2011-06-14 DIAGNOSIS — N915 Oligomenorrhea, unspecified: Secondary | ICD-10-CM

## 2011-06-14 DIAGNOSIS — E282 Polycystic ovarian syndrome: Secondary | ICD-10-CM

## 2011-06-14 DIAGNOSIS — E663 Overweight: Secondary | ICD-10-CM

## 2011-06-14 NOTE — Progress Notes (Signed)
Patient ID: Kerri [name], female   DOB: April 07, 1980, 31 y.o.   MRN: [id number] Spoke with pt about her [facility] L[facility]:3/31. Pt said that she canceled her appointment because it was on a Tuesday and can only have appointment on Wednesday or Thursday only.

## 2011-06-15 LAB — GLUCOSE, RANDOM: Glucose, Bld: 90 mg/dL (ref 70–99)

## 2011-06-15 LAB — PROLACTIN: Prolactin: 9.6 ng/mL

## 2011-06-18 LAB — DHEA: DHEA: 478 ng/dL (ref 102–1185)

## 2011-06-19 ENCOUNTER — Ambulatory Visit (HOSPITAL_COMMUNITY): Payer: PRIVATE HEALTH INSURANCE

## 2011-06-19 ENCOUNTER — Telehealth: Payer: Self-pay | Admitting: *Deleted

## 2011-06-19 NOTE — Telephone Encounter (Signed)
Pt informed of normal test results. 06/14/11

## 2012-07-30 ENCOUNTER — Emergency Department (HOSPITAL_COMMUNITY): Payer: BC Managed Care – PPO

## 2012-07-30 ENCOUNTER — Encounter (HOSPITAL_COMMUNITY): Payer: Self-pay | Admitting: *Deleted

## 2012-07-30 ENCOUNTER — Emergency Department (HOSPITAL_COMMUNITY)
Admission: EM | Admit: 2012-07-30 | Discharge: 2012-07-30 | Disposition: A | Discharge status: Home / Self Care | Payer: BC Managed Care – PPO | Attending: Emergency Medicine | Admitting: Emergency Medicine

## 2012-07-30 DIAGNOSIS — R0602 Shortness of breath: Secondary | ICD-10-CM

## 2012-07-30 DIAGNOSIS — R0609 Other forms of dyspnea: Secondary | ICD-10-CM | POA: Insufficient documentation

## 2012-07-30 DIAGNOSIS — M79609 Pain in unspecified limb: Secondary | ICD-10-CM | POA: Insufficient documentation

## 2012-07-30 DIAGNOSIS — R0789 Other chest pain: Secondary | ICD-10-CM | POA: Insufficient documentation

## 2012-07-30 DIAGNOSIS — Z8679 Personal history of other diseases of the circulatory system: Secondary | ICD-10-CM | POA: Insufficient documentation

## 2012-07-30 DIAGNOSIS — E669 Obesity, unspecified: Secondary | ICD-10-CM | POA: Insufficient documentation

## 2012-07-30 DIAGNOSIS — Z8619 Personal history of other infectious and parasitic diseases: Secondary | ICD-10-CM | POA: Insufficient documentation

## 2012-07-30 DIAGNOSIS — R0600 Dyspnea, unspecified: Secondary | ICD-10-CM

## 2012-07-30 DIAGNOSIS — R0989 Other specified symptoms and signs involving the circulatory and respiratory systems: Secondary | ICD-10-CM | POA: Insufficient documentation

## 2012-07-30 DIAGNOSIS — Z79899 Other long term (current) drug therapy: Secondary | ICD-10-CM | POA: Insufficient documentation

## 2012-07-30 DIAGNOSIS — F411 Generalized anxiety disorder: Secondary | ICD-10-CM | POA: Insufficient documentation

## 2012-07-30 DIAGNOSIS — Z8672 Personal history of thrombophlebitis: Secondary | ICD-10-CM | POA: Insufficient documentation

## 2012-07-30 LAB — CBC
HCT: 41.4 % (ref 36.0–46.0)
MCH: 30.1 pg (ref 26.0–34.0)
Platelets: 261 10*3/uL (ref 150–400)
RDW: 12.7 % (ref 11.5–15.5)

## 2012-07-30 LAB — BASIC METABOLIC PANEL
CO2: 24 mEq/L (ref 19–32)
Calcium: 10.1 mg/dL (ref 8.4–10.5)
Glucose, Bld: 84 mg/dL (ref 70–99)
Potassium: 4.2 mEq/L (ref 3.5–5.1)
Sodium: 135 mEq/L (ref 135–145)

## 2012-07-30 LAB — POCT I-STAT TROPONIN I: Troponin i, poc: 0 ng/mL (ref 0.00–0.08)

## 2012-07-30 MED ORDER — OMEPRAZOLE 20 MG PO CPDR: 20.0000 mg | DELAYED_RELEASE_CAPSULE | Freq: Every day | ORAL | Status: DC

## 2012-07-30 MED ORDER — ALBUTEROL SULFATE HFA 108 (90 BASE) MCG/ACT IN AERS
2.0000 | INHALATION_SPRAY | Freq: Once | RESPIRATORY_TRACT | Status: AC
  Administered 2012-07-30: 2 via RESPIRATORY_TRACT
  Filled 2012-07-30: qty 6.7

## 2012-07-30 MED ORDER — IOHEXOL 350 MG/ML SOLN
100.0000 mL | Freq: Once | INTRAVENOUS | Status: AC | PRN
  Administered 2012-07-30: 100 mL via INTRAVENOUS

## 2012-07-30 NOTE — ED Provider Notes (Signed)
Medical screening examination/treatment/procedure(s) were conducted as a shared visit with non-physician practitioner(s) and myself.  I personally evaluated the patient during the encounter  No significant improvement with albuterol.  Vital signs are normal.  Pulse ox is normal.  CT scans are without evidence of PE or other lung pathology to account for process.  May be GI related and thus the patient will be initiated on Prilosec and when necessary Maalox/tums.  She understands to return the emergency apartment for new or worsening symptoms  Lyanne Co, MD 07/30/12 2100

## 2012-07-30 NOTE — Progress Notes (Signed)
Right lower extremity venous duplex completed.  Right:  No evidence of DVT, superficial thrombosis, or Baker's cyst.  Left:  Negative for DVT in the common femoral vein.  

## 2012-07-30 NOTE — ED Notes (Addendum)
Pt c/o of sob since Friday. Sob worse with exertion. Hx of anxiety. Stopped Clonopin X 2 mths to start fertility treatments. Pt calm, cooperative. Speaking in complete sentences. O2 sat 98% on RA.

## 2012-07-30 NOTE — ED Provider Notes (Signed)
History     CSN: [id number]  Arrival date & time 07/30/12  1749   First MD Initiated Contact with Patient 07/30/12 1812      Chief Complaint  Patient presents with  . Shortness of Breath    (Consider location/radiation/quality/duration/timing/severity/associated sxs/prior treatment) HPI Comments: 32 y/o female with a PMHx of anxiety, phlebitis and thrombophlebitis presents to the ED complaining of sudden onset shortness of breath beginning 5 days ago. Patient states she was not doing anything in specific when the shortness of breath began and has been constant since onset, worse on exertion. Admits to associated mid-sternal chest tightness without pain. Thought symptoms were maybe related to anxiety, however took a Clonopin without relief. Denies nausea, vomiting, diaphoresis, fever, chills, cough. She is in the process of fertility treatment with progesterone, metformin and clomid. Went to her ob/gyn today and discussed her symptoms and was advised to go to the ED so she went to [facility]. At urgent care had a CXR which was normal and was advised to go to the ED to rule out PE. Admits to pain in right calf which is normal for her due to phlebitis s/p sclerotherapy. Stands on her feet all day for her job.  The history is provided by the patient and the spouse.    Past Medical History  Diagnosis Date  . Anxiety   . History of HPV infection 2003  . Phlebitis and thrombophlebitis   . Blood clot in vein     Past Surgical History  Procedure Laterality Date  . Gynecologic cryosurgery  2003    Family History  Problem Relation Age of Onset  . Cancer Maternal Grandmother     lung  . Kidney disease Paternal Grandfather   . Hypertension Father   . Hypertension Brother   . Hypertension Paternal Grandmother     History  Substance Use Topics  . Smoking status: Never Smoker   . Smokeless tobacco: Never Used  . Alcohol Use: 0.0 oz/week     Comment: X 2 a wk    OB History     Grav Para Term Preterm Abortions TAB SAB Ect Mult Living   1 0 0 0 1  1 0 0 0      Review of Systems  Constitutional: Negative for fever and chills.  HENT: Negative for neck pain and neck stiffness.   Respiratory: Positive for chest tightness and shortness of breath. Negative for cough and wheezing.   Cardiovascular: Negative for chest pain and leg swelling.  Gastrointestinal: Negative for nausea and vomiting.  Musculoskeletal: Negative for back pain.  Neurological: Negative for light-headedness.  Psychiatric/Behavioral: Negative for confusion.  All other systems reviewed and are negative.    Allergies  Review of patient's allergies indicates no known allergies.  Home Medications   Current Outpatient Rx  Name  Route  Sig  Dispense  Refill  . clonazePAM (KLONOPIN) 1 MG tablet   Oral   Take 1 mg by mouth 2 (two) times daily as needed.         Marland Kitchen EXPIRED: medroxyPROGESTERone (PROVERA) 10 MG tablet   Oral   Take 1 tablet (10 mg total) by mouth daily.   10 tablet   3   . Prenatal Vit-Fe Fumarate-FA (PRENATAL MULTIVITAMIN) TABS   Oral   Take 1 tablet by mouth daily.           BP 125/96  Pulse 99  Temp(Src) 98.4 F (36.9 C) (Oral)  Resp 16  SpO2  98%  LMP 07/09/2012  Physical Exam  Nursing note and vitals reviewed. Constitutional: She is oriented to person, place, and time. She appears well-developed. No distress.  Overweight  HENT:  Head: Normocephalic and atraumatic.  Mouth/Throat: Oropharynx is clear and moist.  Eyes: Conjunctivae and EOM are normal. Pupils are equal, round, and reactive to light.  Neck: Normal range of motion. Neck supple. No tracheal deviation present.  Cardiovascular: Normal rate, regular rhythm, normal heart sounds and intact distal pulses.   No extremity edema.  Pulmonary/Chest: Breath sounds normal. No respiratory distress. She has no decreased breath sounds. She has no wheezes. She has no rhonchi. She has no rales.  Abdominal:  Soft. Bowel sounds are normal.  Musculoskeletal: Normal range of motion. She exhibits no edema.       Legs: Neurological: She is alert and oriented to person, place, and time.  Skin: Skin is warm and dry. She is not diaphoretic.  Psychiatric: She has a normal mood and affect. Her behavior is normal.    ED Course  Procedures (including critical care time)  Labs Reviewed  CBC  BASIC METABOLIC PANEL  POCT I-STAT TROPONIN I    Date: 07/30/2012  Rate: 94  Rhythm: normal sinus rhythm  QRS Axis: normal  Intervals: normal  ST/T Wave abnormalities: normal  Conduction Disutrbances:none  Narrative Interpretation: no stemi  Old EKG Reviewed: none available   Ct Angio Chest Pe W/cm &/or Wo Cm  07/30/2012   *RADIOLOGY REPORT*  Clinical Data: Short of breath,  CT ANGIOGRAPHY CHEST  Technique:  Multidetector CT imaging of the chest using the standard protocol during bolus administration of intravenous contrast. Multiplanar reconstructed images including MIPs were obtained and reviewed to evaluate the vascular anatomy.  Contrast: OMNIPAQUE IOHEXOL 350 MG/ML SOLN  Comparison: None.  Findings: Negative for pulmonary embolism.  Negative for aortic dissection or aneurysm.  Heart size is mildly enlarged without pericardial effusion.  The lungs are clear without infiltrate effusion.  Negative for mass or adenopathy.  No acute bony changes. Fatty infiltration of the liver is noted.  IMPRESSION: Negative   Original Report Authenticated By: Janeece Riggers, M.D.     No diagnosis found.    MDM  32 y/o female presenting with SOB. Concern for PE. Obtaining CT angio chest along with RLE duplex US due to calf tenderness and history of thrombophlebitis. She is in NAD, O2 sat 98% on RA. 7:39 PM RLE duplex US negative for DVT. 8:04 PM CT angio negative for PE. Will give 2 puffs albuterol and re-evaluate. If no improvement, will give omeprazole. Case discussed with Dr. Patria Mane who will take over care of  patient at this time at shift change.  Trevor Mace, PA-C 07/30/12 2005

## 2013-02-24 LAB — OB RESULTS CONSOLE ABO/RH: RH Type: NEGATIVE

## 2013-02-24 LAB — OB RESULTS CONSOLE HIV ANTIBODY (ROUTINE TESTING): HIV: NONREACTIVE

## 2013-02-24 LAB — OB RESULTS CONSOLE GC/CHLAMYDIA
CHLAMYDIA, DNA PROBE: NEGATIVE
GC PROBE AMP, GENITAL: NEGATIVE

## 2013-02-24 LAB — OB RESULTS CONSOLE ANTIBODY SCREEN: Antibody Screen: NEGATIVE

## 2013-02-24 LAB — OB RESULTS CONSOLE RUBELLA ANTIBODY, IGM: Rubella: IMMUNE

## 2013-02-24 LAB — OB RESULTS CONSOLE RPR: RPR: NONREACTIVE

## 2013-02-24 LAB — OB RESULTS CONSOLE HEPATITIS B SURFACE ANTIGEN: Hepatitis B Surface Ag: NEGATIVE

## 2013-03-12 NOTE — L&D Delivery Note (Signed)
Delivery Note At 1:30 PM a viable female was delivered via  NSVD (Presentation: OA;  ).  APGAR:8/9 , ; weight .PENDING   Placenta status:INTACT , .3 VESSEL  Cord:  with the following complications:NONE .  Cord pH: NA  Anesthesia: Epidural  Episiotomy: NONNE Lacerations: FIRST Suture Repair: 2.0 chromic Est. Blood Loss (mL): 350  Mom to postpartum.  Baby to Couplet care / Skin to Skin.  Aki Burdin S 09/26/2013, 1:45 PM

## 2013-06-20 ENCOUNTER — Encounter (HOSPITAL_COMMUNITY): Payer: Self-pay | Admitting: *Deleted

## 2013-06-20 ENCOUNTER — Inpatient Hospital Stay (HOSPITAL_COMMUNITY)
Admission: AD | Admit: 2013-06-20 | Discharge: 2013-06-20 | Disposition: A | Discharge status: Home / Self Care | Payer: BC Managed Care – PPO | Source: Ambulatory Visit | Attending: Obstetrics and Gynecology | Admitting: Obstetrics and Gynecology

## 2013-06-20 DIAGNOSIS — N949 Unspecified condition associated with female genital organs and menstrual cycle: Secondary | ICD-10-CM

## 2013-06-20 DIAGNOSIS — O360990 Maternal care for other rhesus isoimmunization, unspecified trimester, not applicable or unspecified: Secondary | ICD-10-CM | POA: Insufficient documentation

## 2013-06-20 DIAGNOSIS — O368390 Maternal care for abnormalities of the fetal heart rate or rhythm, unspecified trimester, not applicable or unspecified: Secondary | ICD-10-CM | POA: Insufficient documentation

## 2013-06-20 DIAGNOSIS — IMO0002 Reserved for concepts with insufficient information to code with codable children: Secondary | ICD-10-CM

## 2013-06-20 DIAGNOSIS — Z3490 Encounter for supervision of normal pregnancy, unspecified, unspecified trimester: Secondary | ICD-10-CM

## 2013-06-20 DIAGNOSIS — O99340 Other mental disorders complicating pregnancy, unspecified trimester: Secondary | ICD-10-CM | POA: Insufficient documentation

## 2013-06-20 DIAGNOSIS — F411 Generalized anxiety disorder: Secondary | ICD-10-CM | POA: Insufficient documentation

## 2013-06-20 HISTORY — DX: Unspecified blood type, rh negative: Z67.91

## 2013-06-20 HISTORY — DX: Other specified pregnancy related conditions, unspecified trimester: O26.899

## 2013-06-20 NOTE — Progress Notes (Signed)
Jannifer RodneyLinda Barefoot NP

## 2013-06-20 NOTE — Discharge Instructions (Signed)

## 2013-06-20 NOTE — MAU Provider Note (Signed)
History     CSN: [id number]  Arrival date and time: 06/20/13 2228   First Provider Initiated Contact with Patient 06/20/13 2301      Chief Complaint  Patient presents with  . Pelvic Pain   HPI Comments: Kerri [name] 33 y.o. [redacted]w[redacted]d G2P0010 presents to MAU with concerns over baby's heart beat being too fast. They were monitoring it at home and it was in 178 range. She called Dr Kerri [name] who advised her to come to MAU. She has some anxiety issues and tonight is most anxious because she had spotting in early pregnancy and was not given rhogam. She also feels her cervix is in spasm.   Pelvic Pain      Past Medical History  Diagnosis Date  . Anxiety   . History of HPV infection 2003  . Phlebitis and thrombophlebitis   . Blood clot in vein   . Rh negative status during pregnancy     Past Surgical History  Procedure Laterality Date  . Gynecologic cryosurgery  2003    Family History  Problem Relation Age of Onset  . Cancer Maternal Grandmother     lung  . Kidney disease Paternal Grandfather   . Hypertension Father   . Hypertension Brother   . Hypertension Paternal Grandmother     History  Substance Use Topics  . Smoking status: Never Smoker   . Smokeless tobacco: Never Used  . Alcohol Use: 0.0 oz/week     Comment: X 2 a wk    Allergies: No Known Allergies  Prescriptions prior to admission  Medication Sig Dispense Refill  . aspirin 81 MG tablet Take 81 mg by mouth daily.      . metFORMIN (GLUCOPHAGE) 500 MG tablet Take 250 mg by mouth daily with breakfast.       . Prenatal Vit-Fe Fumarate-FA (PRENATAL MULTIVITAMIN) TABS Take 1 tablet by mouth daily.      Marland Kitchen albuterol (PROVENTIL HFA;VENTOLIN HFA) 108 (90 BASE) MCG/ACT inhaler Inhale 2 puffs into the lungs every 6 (six) hours as needed for wheezing or shortness of breath.      . cephALEXin (KEFLEX) 500 MG capsule Take 500 mg by mouth 4 (four) times daily.      . clomiPHENE (CLOMID) 50 MG tablet Take 100 mg by mouth See  admin instructions. Take one tablet  On days 3-7 of menstrual cycle      . clonazePAM (KLONOPIN) 1 MG tablet Take 1 mg by mouth 2 (two) times daily as needed for anxiety.       Marland Kitchen ibuprofen (ADVIL,MOTRIN) 200 MG tablet Take 400 mg by mouth every 6 (six) hours as needed for pain or headache.      . medroxyPROGESTERone (PROVERA) 10 MG tablet Take 1 tablet (10 mg total) by mouth daily.  10 tablet  3  . omeprazole (PRILOSEC) 20 MG capsule Take 1 capsule (20 mg total) by mouth daily.  30 capsule  0  . progesterone (PROMETRIUM) 200 MG capsule Take 200 mg by mouth See admin instructions. Once capsule at bedtime for 7 days        Review of Systems  Constitutional: Negative.   HENT: Negative.   Eyes: Negative.   Respiratory: Negative.   Cardiovascular: Negative.   Gastrointestinal: Negative.        Feels some flutter in her vaginal area  Genitourinary: Negative.   Musculoskeletal: Negative.   Skin: Negative.   Neurological: Negative.   Psychiatric/Behavioral: The patient is nervous/anxious.    Physical Exam  Blood pressure 128/79, pulse 103, temperature 98.7 F (37.1 C), resp. rate 20, height 5\' 6"  (1.676 m), weight 234 lb (106.142 kg), last menstrual period 12/21/2012.  Physical Exam  Constitutional: She is oriented to person, place, and time. She appears well-developed and well-nourished. No distress.  HENT:  Head: Normocephalic and atraumatic.  Eyes: Pupils are equal, round, and reactive to light.  GI: Soft. Bowel sounds are normal. She exhibits no distension. There is no tenderness. There is no rebound and no guarding.  Genitourinary:  Genital:External negative Vaginal:thin white discharge Cervix:closed/ thick Bimanual:nontender   Musculoskeletal: Normal range of motion.  Neurological: She is alert and oriented to person, place, and time.  Skin: Skin is warm and dry.  Psychiatric: Her behavior is normal. Judgment and thought content normal. Her mood appears anxious.    MAU  Course  Procedures  MDM  Fetal monitoring heart rate 150, no contractions, reactive Called Dr Kerri SneddonMcComb  Assessment and Plan   A:  Pregnancy Fetal heart beat elevated  P: Reassurance Follow up with Dr Kerri [name] in office  Kerri AlbinoLinda M Jourdan [name] 06/20/2013, 11:20 PM

## 2013-06-20 NOTE — MAU Note (Signed)
We've been listening by doppler at home and baby's HR 178 tonight and had felt some ? Spasms in cervix. FHR usually in 140s

## 2013-07-22 ENCOUNTER — Encounter: Payer: BC Managed Care – PPO | Attending: Obstetrics and Gynecology

## 2013-07-22 VITALS — Ht 66.0 in | Wt 234.1 lb

## 2013-07-22 DIAGNOSIS — O99810 Abnormal glucose complicating pregnancy: Secondary | ICD-10-CM | POA: Insufficient documentation

## 2013-07-22 DIAGNOSIS — Z713 Dietary counseling and surveillance: Secondary | ICD-10-CM | POA: Insufficient documentation

## 2013-07-22 NOTE — Progress Notes (Signed)
  Patient was seen on 07/22/13 for Gestational Diabetes self-management class at the [facility]. The following learning objectives were met by the patient during this course:   States the definition of Gestational Diabetes  States why dietary management is important in controlling blood glucose  Describes the effects of carbohydrates on blood glucose levels  Demonstrates ability to create a balanced meal plan  Demonstrates carbohydrate counting   States when to check blood glucose levels  Demonstrates proper blood glucose monitoring techniques  States the effect of stress and exercise on blood glucose levels  States the importance of limiting caffeine and abstaining from alcohol and smoking  Plan:  Aim for 2 Carb Choices per meal (30 grams) +/- 1 either way for breakfast Aim for 3 Carb Choices per meal (45 grams) +/- 1 either way from lunch and dinner Aim for 1-2 Carbs per snack Begin reading food labels for Total Carbohydrate and sugar grams of foods Consider  increasing your activity level by walking daily as tolerated Begin checking BG before breakfast and 2 hours after first bit of breakfast, lunch and dinner after  as directed by MD  Take medication  as directed by MD  Blood glucose monitor given: None, already testing  Patient instructed to monitor glucose levels: FBS: 60 - <90 2 hour: <120  Patient received the following handouts:  Nutrition Diabetes and Pregnancy  Carbohydrate Counting List  Meal Planning worksheet  Patient will be seen for follow-up as needed.

## 2013-07-29 ENCOUNTER — Ambulatory Visit: Payer: BC Managed Care – PPO

## 2013-08-24 LAB — OB RESULTS CONSOLE GBS: STREP GROUP B AG: POSITIVE

## 2013-09-24 ENCOUNTER — Encounter (HOSPITAL_COMMUNITY): Payer: Self-pay | Admitting: *Deleted

## 2013-09-24 ENCOUNTER — Telehealth (HOSPITAL_COMMUNITY): Payer: Self-pay | Admitting: *Deleted

## 2013-09-24 NOTE — Telephone Encounter (Signed)
Preadmission screen  

## 2013-09-25 ENCOUNTER — Inpatient Hospital Stay (HOSPITAL_COMMUNITY)
Admission: AD | Admit: 2013-09-25 | Discharge: 2013-09-25 | Disposition: A | Discharge status: Home / Self Care | Payer: BC Managed Care – PPO | Source: Ambulatory Visit | Attending: Obstetrics and Gynecology | Admitting: Obstetrics and Gynecology

## 2013-09-25 ENCOUNTER — Encounter (HOSPITAL_COMMUNITY): Payer: Self-pay | Admitting: *Deleted

## 2013-09-25 ENCOUNTER — Inpatient Hospital Stay (HOSPITAL_COMMUNITY): Payer: BC Managed Care – PPO

## 2013-09-25 DIAGNOSIS — O99810 Abnormal glucose complicating pregnancy: Secondary | ICD-10-CM | POA: Insufficient documentation

## 2013-09-25 DIAGNOSIS — O200 Threatened abortion: Secondary | ICD-10-CM | POA: Insufficient documentation

## 2013-09-25 HISTORY — DX: Gestational diabetes mellitus in pregnancy, unspecified control: O24.419

## 2013-09-25 NOTE — Discharge Instructions (Signed)
Braxton Hicks Contractions °Contractions of the uterus can occur throughout pregnancy. Contractions are not always a sign that you are in labor.  °WHAT ARE BRAXTON HICKS CONTRACTIONS?  °Contractions that occur before labor are called Braxton Hicks contractions, or false labor. Toward the end of pregnancy (32-34 weeks), these contractions can develop more often and may become more forceful. This is not true labor because these contractions do not result in opening (dilatation) and thinning of the cervix. They are sometimes difficult to tell apart from true labor because these contractions can be forceful and people have different pain tolerances. You should not feel embarrassed if you go to the hospital with false labor. Sometimes, the only way to tell if you are in true labor is for your health care provider to look for changes in the cervix. °If there are no prenatal problems or other health problems associated with the pregnancy, it is completely safe to be sent home with false labor and await the onset of true labor. °HOW CAN YOU TELL THE DIFFERENCE BETWEEN TRUE AND FALSE LABOR? °False Labor °· The contractions of false labor are usually shorter and not as hard as those of true labor.   °· The contractions are usually irregular.   °· The contractions are often felt in the front of the lower abdomen and in the groin.   °· The contractions may go away when you walk around or change positions while lying down.   °· The contractions get weaker and are shorter lasting as time goes on.   °· The contractions do not usually become progressively stronger, regular, and closer together as with true labor.   °True Labor °· Contractions in true labor last 30-70 seconds, become very regular, usually become more intense, and increase in frequency.   °· The contractions do not go away with walking.   °· The discomfort is usually felt in the top of the uterus and spreads to the lower abdomen and low back.   °· True labor can be  determined by your health care provider with an exam. This will show that the cervix is dilating and getting thinner.   °WHAT TO REMEMBER °· Keep up with your usual exercises and follow other instructions given by your health care provider.   °· Take medicines as directed by your health care provider.   °· Keep your regular prenatal appointments.   °· Eat and drink lightly if you think you are going into labor.   °· If Braxton Hicks contractions are making you uncomfortable:   °¨ Change your position from lying down or resting to walking, or from walking to resting.   °¨ Sit and rest in a tub of warm water.   °¨ Drink 2-3 glasses of water. Dehydration may cause these contractions.   °¨ Do slow and deep breathing several times an hour.   °WHEN SHOULD I SEEK IMMEDIATE MEDICAL CARE? °Seek immediate medical care if: °· Your contractions become stronger, more regular, and closer together.   °· You have fluid leaking or gushing from your vagina.   °· You have a fever.   °· You pass blood-tinged mucus.   °· You have vaginal bleeding.   °· You have continuous abdominal pain.   °· You have low back pain that you never had before.   °· You feel your baby's head pushing down and causing pelvic pressure.   °· Your baby is not moving as much as it used to.   °Document Released: 02/26/2005 Document Revised: 03/03/2013 Document Reviewed: 12/08/2012 °Exit[employer]® Patient Information ©2015 [employer]. This information is not intended to replace advice given to you by your health care   provider. Make sure you discuss any questions you have with your health care provider. ° °

## 2013-09-25 NOTE — MAU Note (Signed)
irreg ctx's 4-4910min, came in because of GBS. Was 2cm yesterday.  Having bloody mucous since exam.  Gest diab with this preg

## 2013-09-26 ENCOUNTER — Encounter (HOSPITAL_COMMUNITY): Payer: Self-pay | Admitting: *Deleted

## 2013-09-26 ENCOUNTER — Telehealth (HOSPITAL_COMMUNITY): Payer: Self-pay | Admitting: *Deleted

## 2013-09-26 ENCOUNTER — Inpatient Hospital Stay (HOSPITAL_COMMUNITY): Payer: BC Managed Care – PPO | Admitting: Anesthesiology

## 2013-09-26 ENCOUNTER — Inpatient Hospital Stay (HOSPITAL_COMMUNITY)
Admission: AD | Admit: 2013-09-26 | Discharge: 2013-09-28 | DRG: 775 | Disposition: A | Discharge status: Home / Self Care | Payer: BC Managed Care – PPO | Source: Ambulatory Visit | Attending: Obstetrics and Gynecology | Admitting: Obstetrics and Gynecology

## 2013-09-26 ENCOUNTER — Encounter (HOSPITAL_COMMUNITY): Payer: BC Managed Care – PPO | Admitting: Anesthesiology

## 2013-09-26 DIAGNOSIS — O9989 Other specified diseases and conditions complicating pregnancy, childbirth and the puerperium: Secondary | ICD-10-CM

## 2013-09-26 DIAGNOSIS — O99892 Other specified diseases and conditions complicating childbirth: Secondary | ICD-10-CM | POA: Diagnosis present

## 2013-09-26 DIAGNOSIS — O09519 Supervision of elderly primigravida, unspecified trimester: Secondary | ICD-10-CM | POA: Diagnosis present

## 2013-09-26 DIAGNOSIS — Z8672 Personal history of thrombophlebitis: Secondary | ICD-10-CM

## 2013-09-26 DIAGNOSIS — F411 Generalized anxiety disorder: Secondary | ICD-10-CM | POA: Diagnosis present

## 2013-09-26 DIAGNOSIS — O360990 Maternal care for other rhesus isoimmunization, unspecified trimester, not applicable or unspecified: Secondary | ICD-10-CM | POA: Diagnosis present

## 2013-09-26 DIAGNOSIS — IMO0001 Reserved for inherently not codable concepts without codable children: Secondary | ICD-10-CM

## 2013-09-26 DIAGNOSIS — Z22330 Carrier of Group B streptococcus: Secondary | ICD-10-CM

## 2013-09-26 DIAGNOSIS — Z8249 Family history of ischemic heart disease and other diseases of the circulatory system: Secondary | ICD-10-CM

## 2013-09-26 DIAGNOSIS — O99344 Other mental disorders complicating childbirth: Secondary | ICD-10-CM | POA: Diagnosis present

## 2013-09-26 DIAGNOSIS — O99814 Abnormal glucose complicating childbirth: Principal | ICD-10-CM | POA: Diagnosis present

## 2013-09-26 LAB — CBC
HEMATOCRIT: 42.1 % (ref 36.0–46.0)
Hemoglobin: 15 g/dL (ref 12.0–15.0)
MCH: 31.6 pg (ref 26.0–34.0)
MCHC: 35.6 g/dL (ref 30.0–36.0)
MCV: 88.8 fL (ref 78.0–100.0)
PLATELETS: 199 10*3/uL (ref 150–400)
RBC: 4.74 MIL/uL (ref 3.87–5.11)
RDW: 13.8 % (ref 11.5–15.5)
WBC: 12.1 10*3/uL — ABNORMAL HIGH (ref 4.0–10.5)

## 2013-09-26 LAB — TYPE AND SCREEN
ABO/RH(D): O NEG
ANTIBODY SCREEN: POSITIVE
DAT, IgG: NEGATIVE

## 2013-09-26 LAB — RPR

## 2013-09-26 MED ORDER — EPHEDRINE 5 MG/ML INJ
10.0000 mg | INTRAVENOUS | Status: DC | PRN
  Filled 2013-09-26: qty 2

## 2013-09-26 MED ORDER — LANOLIN HYDROUS EX OINT: TOPICAL_OINTMENT | CUTANEOUS | Status: DC | PRN

## 2013-09-26 MED ORDER — LACTATED RINGERS IV SOLN: 500.0000 mL | INTRAVENOUS | Status: DC | PRN

## 2013-09-26 MED ORDER — DIPHENHYDRAMINE HCL 50 MG/ML IJ SOLN: 12.5000 mg | INTRAMUSCULAR | Status: DC | PRN

## 2013-09-26 MED ORDER — ONDANSETRON HCL 4 MG/2ML IJ SOLN: 4.0000 mg | Freq: Four times a day (QID) | INTRAMUSCULAR | Status: DC | PRN

## 2013-09-26 MED ORDER — DIBUCAINE 1 % RE OINT: 1.0000 "application " | TOPICAL_OINTMENT | RECTAL | Status: DC | PRN

## 2013-09-26 MED ORDER — WITCH HAZEL-GLYCERIN EX PADS
1.0000 "application " | MEDICATED_PAD | CUTANEOUS | Status: DC | PRN
  Administered 2013-09-28: 1 via TOPICAL

## 2013-09-26 MED ORDER — FLEET ENEMA 7-19 GM/118ML RE ENEM: 1.0000 | ENEMA | RECTAL | Status: DC | PRN

## 2013-09-26 MED ORDER — FLEET ENEMA 7-19 GM/118ML RE ENEM: 1.0000 | ENEMA | Freq: Every day | RECTAL | Status: DC | PRN

## 2013-09-26 MED ORDER — PHENYLEPHRINE 40 MCG/ML (10ML) SYRINGE FOR IV PUSH (FOR BLOOD PRESSURE SUPPORT)
PREFILLED_SYRINGE | INTRAVENOUS | Status: AC
  Filled 2013-09-26: qty 10

## 2013-09-26 MED ORDER — SIMETHICONE 80 MG PO CHEW
80.0000 mg | CHEWABLE_TABLET | ORAL | Status: DC | PRN
Start: 2013-09-26 — End: 2013-09-28

## 2013-09-26 MED ORDER — OXYCODONE-ACETAMINOPHEN 5-325 MG PO TABS: 1.0000 | ORAL_TABLET | ORAL | Status: DC | PRN

## 2013-09-26 MED ORDER — SODIUM CHLORIDE 0.9 % IV SOLN
2.0000 g | Freq: Once | INTRAVENOUS | Status: AC
  Administered 2013-09-26: 2 g via INTRAVENOUS
  Filled 2013-09-26: qty 2000

## 2013-09-26 MED ORDER — LIDOCAINE HCL (PF) 1 % IJ SOLN
INTRAMUSCULAR | Status: DC | PRN
  Administered 2013-09-26: 5 mL
  Administered 2013-09-26: 8 mL

## 2013-09-26 MED ORDER — LACTATED RINGERS IV SOLN
INTRAVENOUS | Status: DC
  Administered 2013-09-26: 09:00:00 via INTRAVENOUS

## 2013-09-26 MED ORDER — ZOLPIDEM TARTRATE 5 MG PO TABS: 5.0000 mg | ORAL_TABLET | Freq: Every evening | ORAL | Status: DC | PRN

## 2013-09-26 MED ORDER — IBUPROFEN 600 MG PO TABS
600.0000 mg | ORAL_TABLET | Freq: Four times a day (QID) | ORAL | Status: DC
  Administered 2013-09-26 – 2013-09-28 (×8): 600 mg via ORAL
  Filled 2013-09-26 (×8): qty 1

## 2013-09-26 MED ORDER — TERBUTALINE SULFATE 1 MG/ML IJ SOLN: 0.2500 mg | Freq: Once | INTRAMUSCULAR | Status: DC | PRN

## 2013-09-26 MED ORDER — OXYCODONE-ACETAMINOPHEN 5-325 MG PO TABS
1.0000 | ORAL_TABLET | ORAL | Status: DC | PRN
  Administered 2013-09-27: 1 via ORAL
  Filled 2013-09-26: qty 1

## 2013-09-26 MED ORDER — IBUPROFEN 600 MG PO TABS: 600.0000 mg | ORAL_TABLET | Freq: Four times a day (QID) | ORAL | Status: DC | PRN

## 2013-09-26 MED ORDER — OXYTOCIN 40 UNITS IN LACTATED RINGERS INFUSION - SIMPLE MED
1.0000 m[IU]/min | INTRAVENOUS | Status: DC
  Administered 2013-09-26: 2 m[IU]/min via INTRAVENOUS

## 2013-09-26 MED ORDER — FENTANYL 2.5 MCG/ML BUPIVACAINE 1/10 % EPIDURAL INFUSION (WH - ANES): 14.0000 mL/h | INTRAMUSCULAR | Status: DC | PRN

## 2013-09-26 MED ORDER — LIDOCAINE HCL (PF) 1 % IJ SOLN
30.0000 mL | INTRAMUSCULAR | Status: DC | PRN
  Filled 2013-09-26: qty 30

## 2013-09-26 MED ORDER — SENNOSIDES-DOCUSATE SODIUM 8.6-50 MG PO TABS
2.0000 | ORAL_TABLET | ORAL | Status: DC
  Administered 2013-09-27 (×2): 2 via ORAL
  Filled 2013-09-26 (×2): qty 2

## 2013-09-26 MED ORDER — ONDANSETRON HCL 4 MG/2ML IJ SOLN: 4.0000 mg | INTRAMUSCULAR | Status: DC | PRN

## 2013-09-26 MED ORDER — FENTANYL 2.5 MCG/ML BUPIVACAINE 1/10 % EPIDURAL INFUSION (WH - ANES)
INTRAMUSCULAR | Status: DC | PRN
  Administered 2013-09-26: 14 mL/h via EPIDURAL

## 2013-09-26 MED ORDER — PHENYLEPHRINE 40 MCG/ML (10ML) SYRINGE FOR IV PUSH (FOR BLOOD PRESSURE SUPPORT)
80.0000 ug | PREFILLED_SYRINGE | INTRAVENOUS | Status: DC | PRN
  Filled 2013-09-26: qty 2

## 2013-09-26 MED ORDER — BENZOCAINE-MENTHOL 20-0.5 % EX AERO
1.0000 "application " | INHALATION_SPRAY | CUTANEOUS | Status: DC | PRN
  Administered 2013-09-27: 1 via TOPICAL
  Filled 2013-09-26: qty 56

## 2013-09-26 MED ORDER — ACETAMINOPHEN 325 MG PO TABS: 650.0000 mg | ORAL_TABLET | ORAL | Status: DC | PRN

## 2013-09-26 MED ORDER — DIPHENHYDRAMINE HCL 25 MG PO CAPS: 25.0000 mg | ORAL_CAPSULE | Freq: Four times a day (QID) | ORAL | Status: DC | PRN

## 2013-09-26 MED ORDER — PHENYLEPHRINE 40 MCG/ML (10ML) SYRINGE FOR IV PUSH (FOR BLOOD PRESSURE SUPPORT)
80.0000 ug | PREFILLED_SYRINGE | INTRAVENOUS | Status: DC | PRN
  Administered 2013-09-26: 80 ug via INTRAVENOUS
  Filled 2013-09-26: qty 2

## 2013-09-26 MED ORDER — OXYTOCIN 40 UNITS IN LACTATED RINGERS INFUSION - SIMPLE MED
62.5000 mL/h | INTRAVENOUS | Status: DC
  Filled 2013-09-26: qty 1000

## 2013-09-26 MED ORDER — CITRIC ACID-SODIUM CITRATE 334-500 MG/5ML PO SOLN: 30.0000 mL | ORAL | Status: DC | PRN

## 2013-09-26 MED ORDER — LACTATED RINGERS IV SOLN
500.0000 mL | Freq: Once | INTRAVENOUS | Status: AC
  Administered 2013-09-26: 1000 mL via INTRAVENOUS

## 2013-09-26 MED ORDER — OXYTOCIN BOLUS FROM INFUSION: 500.0000 mL | INTRAVENOUS | Status: DC

## 2013-09-26 MED ORDER — FENTANYL 2.5 MCG/ML BUPIVACAINE 1/10 % EPIDURAL INFUSION (WH - ANES)
INTRAMUSCULAR | Status: AC
  Filled 2013-09-26: qty 125

## 2013-09-26 MED ORDER — PRENATAL MULTIVITAMIN CH
1.0000 | ORAL_TABLET | Freq: Every day | ORAL | Status: DC
  Administered 2013-09-27 – 2013-09-28 (×2): 1 via ORAL
  Filled 2013-09-26 (×2): qty 1

## 2013-09-26 MED ORDER — TETANUS-DIPHTH-ACELL PERTUSSIS 5-2.5-18.5 LF-MCG/0.5 IM SUSP: 0.5000 mL | Freq: Once | INTRAMUSCULAR | Status: DC

## 2013-09-26 MED ORDER — ONDANSETRON HCL 4 MG PO TABS: 4.0000 mg | ORAL_TABLET | ORAL | Status: DC | PRN

## 2013-09-26 MED ORDER — BISACODYL 10 MG RE SUPP: 10.0000 mg | Freq: Every day | RECTAL | Status: DC | PRN

## 2013-09-26 NOTE — Anesthesia Procedure Notes (Signed)
Epidural Patient location during procedure: OB  Staffing Anesthesiologist: Burman Bruington Performed by: anesthesiologist   Preanesthetic Checklist Completed: patient identified, site marked, surgical consent, pre-op evaluation, timeout performed, IV checked, risks and benefits discussed and monitors and equipment checked  Epidural Patient position: sitting Prep: ChloraPrep Patient monitoring: heart rate, continuous pulse ox and blood pressure Approach: right paramedian Location: L3-L4 Injection technique: LOR saline  Needle:  Needle type: Tuohy  Needle gauge: 17 G Needle length: 9 cm and 9 Needle insertion depth: 7 cm Catheter type: closed end flexible Catheter size: 20 Guage Catheter at skin depth: 11 cm Test dose: negative  Assessment Events: blood not aspirated, injection not painful, no injection resistance, negative IV test and no paresthesia  Additional Notes   Patient tolerated the insertion well without complications.   

## 2013-09-26 NOTE — Lactation Note (Signed)
This note was copied from the chart of Kerri Imogene Burnamela Dimiceli. Lactation Consultation Note  Patient Name: Kerri [name] ZOXWR'UToday's Date: 09/26/2013 Reason for consult: Initial assessment;Difficult latch;Breast/nipple pain (mom has sensitive nipples) Mom is primipara with extremely sensitive nipples which are short but everted.  Breasts are soft and compressible and colostrum is easily expressed.  Mom and baby both need help and practice and FOB shown how he can assist.  Baby has been spitty this evening so LC suggested starting with football position.  Baby able to latch well with breast support and compression throughout feeding but slipped down and mom needed to break suction and resume on (L) in cross-cradle, then football hold.  Total feeding >15 minutes with audible swallows.  LC reviewed positioning and signs of proper latch, encouraged STS with feedings and cue feedings.  Mom encouraged to feed baby 8-12 times/24 hours and with feeding cues. LC encouraged review of Baby and Me pp 9, 14 and 20-25 for STS and BF information. LC provided [facility] Resource brochure and [facility] and list of community and web site resources.    Maternal Data Formula Feeding for Exclusion: No Infant to breast within first hour of birth: Yes (initial LATCH score=6; baby nursed 10 minutes) Has patient been taught Hand Expression?: Yes (LC demonstrated) Does the patient have breastfeeding experience prior to this delivery?: No  Feeding Feeding Type: Breast Fed Length of feed: 15 min (several latches for about 5 minutes before slipping off)  LATCH Score/Interventions Latch: Grasps breast easily, tongue down, lips flanged, rhythmical sucking. (mom and baby need help with positioning and compressing breast)  Audible Swallowing: Spontaneous and intermittent Intervention(s): Skin to skin;Hand expression  Type of Nipple: Everted at rest and after stimulation (short and sensitive)  Comfort (Breast/Nipple): Filling,  red/small blisters or bruises, mild/mod discomfort  Problem noted: Mild/Moderate discomfort (pain level 3-4; less when baby sustains deep latch) Interventions (Mild/moderate discomfort): Hand expression (needs to express her colostrum and ensure deep latch with breast support and compression)  Hold (Positioning): Assistance needed to correctly position infant at breast and maintain latch. Intervention(s): Breastfeeding basics reviewed;Support Pillows;Position options;Skin to skin (baby latched well in football on both breasts and for brief latch in cross-cradle on (L))  LATCH Score: 8  Lactation Tools Discussed/Used   STS, hand expression, cue feedings  Consult Status Consult Status: Follow-up Date: 09/27/13 Follow-up type: In-patient    Warrick ParisianBryant, Joushua Dugar [facility] 09/26/2013, 9:18 PM

## 2013-09-26 NOTE — MAU Note (Signed)
Up to BR at 0600 and had gush of blood. Contractions. No further heavy bleeding. Scarring on cervix from cryo

## 2013-09-26 NOTE — H&P (Signed)
Kerri [name] is a 33 y.o. female presenting with onset of labor at 39.6.  Gestational diabetes.  Positive GBS. Maternal Medical History:  Reason for admission: Vaginal bleeding.   Contractions: Onset was 1-2 hours ago.   Frequency: regular.   Perceived severity is moderate.    Fetal activity: Perceived fetal activity is normal.    Prenatal complications: no prenatal complications Prenatal Complications - Diabetes: gestational. Diabetes is managed by diet.      OB History   Grav Para Term Preterm Abortions TAB SAB Ect Mult Living   1 0 0 0 0  0 0 0 0     Past Medical History  Diagnosis Date  . Anxiety   . History of HPV infection 2003  . Phlebitis and thrombophlebitis   . Blood clot in vein   . Rh negative status during pregnancy   . Polycystic ovarian syndrome   . Headache(784.0)   . Gestational diabetes    Past Surgical History  Procedure Laterality Date  . Gynecologic cryosurgery  2003   Family History: family history includes Cancer in her maternal grandmother; Hypertension in her brother, father, and paternal grandmother; Kidney disease in her paternal grandfather. Social History:  reports that she has never smoked. She has never used smokeless tobacco. She reports that she drinks alcohol. She reports that she does not use illicit drugs.   Prenatal Transfer Tool  Maternal Diabetes: No Genetic Screening: Normal Maternal Ultrasounds/Referrals: Normal Fetal Ultrasounds or other Referrals:  None Maternal Substance Abuse:  No Significant Maternal Medications:  None Significant Maternal Lab Results:  None Other Comments:  None  ROS  Dilation: 8 Effacement (%): 100 Station: -2 Exam by:: Handy Mcloud Blood pressure 144/98, pulse 110, temperature 99.1 F (37.3 C), resp. rate 22, height 5\' 6"  (1.676 m), weight 105.779 kg (233 lb 3.2 oz), last menstrual period 12/21/2012, SpO2 99.00%. Maternal Exam:  Uterine Assessment: Contraction strength is moderate.  Contraction  frequency is regular.   Abdomen: Patient reports no abdominal tenderness. Estimated fetal weight is 8.   Fetal presentation: vertex  Pelvis: adequate for delivery.   Cervix: 8 cm 100% vtx -1  Physical Exam  Prenatal labs: ABO, Rh: O/Negative/-- (12/16 0000) Antibody: Negative (12/16 0000) Rubella: Immune (12/16 0000) RPR: Nonreactive (12/16 0000)  HBsAg: Negative (12/16 0000)  HIV: Non-reactive (12/16 0000)  GBS: Positive (06/15 0000)   Assessment/Plan: IUP at term with SOL Gestational diabetes Positive GBS Routine Labor and delivery   Dierks Wach S 09/26/2013, 9:37 AM

## 2013-09-26 NOTE — Anesthesia Preprocedure Evaluation (Signed)
Anesthesia Evaluation  Patient identified by MRN, date of birth, ID band Patient awake    Reviewed: Allergy & Precautions, H&P , NPO status , Patient's Chart, lab work & pertinent test results  Airway Mallampati: II TM Distance: >3 FB Neck ROM: Full    Dental no notable dental hx.    Pulmonary neg pulmonary ROS,  breath sounds clear to auscultation  Pulmonary exam normal       Cardiovascular negative cardio ROS  Rhythm:Regular Rate:Normal     Neuro/Psych negative neurological ROS  negative psych ROS   GI/Hepatic negative GI ROS, Neg liver ROS,   Endo/Other  diabetes, Gestational  Renal/GU negative Renal ROS  negative genitourinary   Musculoskeletal negative musculoskeletal ROS (+)   Abdominal   Peds negative pediatric ROS (+)  Hematology negative hematology ROS (+)   Anesthesia Other Findings   Reproductive/Obstetrics negative OB ROS                           Anesthesia Physical Anesthesia Plan  ASA: II  Anesthesia Plan: Epidural   Post-op Pain Management:    Induction:   Airway Management Planned:   Additional Equipment:   Intra-op Plan:   Post-operative Plan:   Informed Consent: I have reviewed the patients History and Physical, chart, labs and discussed the procedure including the risks, benefits and alternatives for the proposed anesthesia with the patient or authorized representative who has indicated his/her understanding and acceptance.   Dental advisory given  Plan Discussed with: CRNA  Anesthesia Plan Comments:         Anesthesia Quick Evaluation

## 2013-09-27 LAB — CBC
HEMATOCRIT: 36.1 % (ref 36.0–46.0)
HEMOGLOBIN: 12.6 g/dL (ref 12.0–15.0)
MCH: 31.1 pg (ref 26.0–34.0)
MCHC: 34.9 g/dL (ref 30.0–36.0)
MCV: 89.1 fL (ref 78.0–100.0)
Platelets: 177 10*3/uL (ref 150–400)
RBC: 4.05 MIL/uL (ref 3.87–5.11)
RDW: 13.9 % (ref 11.5–15.5)
WBC: 10.7 10*3/uL — AB (ref 4.0–10.5)

## 2013-09-27 MED ORDER — RHO D IMMUNE GLOBULIN 1500 UNIT/2ML IJ SOSY
300.0000 ug | PREFILLED_SYRINGE | Freq: Once | INTRAMUSCULAR | Status: AC
  Administered 2013-09-27: 300 ug via INTRAMUSCULAR
  Filled 2013-09-27: qty 2

## 2013-09-27 NOTE — Progress Notes (Signed)
Pt stated she thought her epidural catheter was still present. Upon assessment her epidural catheter was in place. Pt is not getting a BTL. Platelets are 199. No reason known as to why her catheter is still present. Contacted L&D RN and they will come to remove epidural catheter. Upon assessment site and dressing is clean, dry and intact. Clinton SawyerWilliamson, [place], RN, BSN 5:18 AM

## 2013-09-27 NOTE — Progress Notes (Signed)
Post Partum Day one  Subjective: no complaints  Objective: Blood pressure 92/63, pulse 92, temperature 98.1 F (36.7 C), temperature source Oral, resp. rate 18, height 5\' 6"  (1.676 m), weight 105.779 kg (233 lb 3.2 oz), last menstrual period 12/21/2012, SpO2 100.00%, unknown if currently breastfeeding.  Physical Exam:  General: alert Lochia: appropriate Uterine Fundus: firm Incision: healing well DVT Evaluation: No evidence of DVT seen on physical exam.   Recent Labs  09/26/13 0830 09/27/13 0602  HGB 15.0 12.6  HCT 42.1 36.1    Assessment/Plan: Plan for discharge tomorrow   LOS: 1 day   Atha Muradyan S 09/27/2013, 10:23 AM

## 2013-09-27 NOTE — Lactation Note (Signed)
This note was copied from the chart of Girl Imogene Burnamela Aller. Lactation Consultation Note  Patient Name: Girl Imogene Burnamela Warsame WUJWJ'XToday's Date: 09/27/2013 Reason for consult: Follow-up assessment Baby 30 hours of life. Mom reports that she needs help latching baby. Mom and FOB just were able to latch baby and baby nursed for 15 minutes. Mom anxious, states that her friend is coming soon and would like to visit. Enc mom to let me assist to latch baby before her friend arrives. Mom able to hand express colostrum from both breast. Mom needed assistance hand expressing, needs a lot of reassurance. Assisted mom to latch baby in football position on right breast. Baby suckled rhythmically for 5 minutes with intermittent swallows noted. Baby pulled off breast, mom not focused on re-latching. Enc mom to undress baby and offer STS after friend leaves and then [facility] with latching the baby.  Maternal Data    Feeding Feeding Type: Breast Fed Length of feed: 15 min  LATCH Score/Interventions Latch: Grasps breast easily, tongue down, lips flanged, rhythmical sucking. Intervention(s): Adjust position;Assist with latch;Breast massage;Breast compression  Audible Swallowing: A few with stimulation Intervention(s): Skin to skin;Hand expression Intervention(s): Skin to skin;Hand expression  Type of Nipple: Everted at rest and after stimulation  Comfort (Breast/Nipple): Soft / non-tender  Problem noted: Mild/Moderate discomfort Interventions (Mild/moderate discomfort): Hand expression;Hand massage  Hold (Positioning): Assistance needed to correctly position infant at breast and maintain latch. Intervention(s): Breastfeeding basics reviewed;Support Pillows;Position options;Skin to skin  LATCH Score: 8  Lactation Tools Discussed/Used     Consult Status      Geralynn OchsWILLIARD, Lane Eland 09/27/2013, 8:15 PM

## 2013-09-27 NOTE — Anesthesia Postprocedure Evaluation (Signed)
Anesthesia Post Note  Patient: Kerri BurnPamela Poplar  Procedure(s) Performed: * No procedures listed *  Anesthesia type: Epidural  Patient location: Mother/Baby  Post pain: Pain level controlled  Post assessment: Post-op Vital signs reviewed  Last Vitals:  Filed Vitals:   09/27/13 0500  BP: 92/63  Pulse: 92  Temp: 36.7 C  Resp: 18    Post vital signs: Reviewed  Level of consciousness:alert  Complications: No apparent anesthesia complications

## 2013-09-28 ENCOUNTER — Inpatient Hospital Stay (HOSPITAL_COMMUNITY): Admission: RE | Admit: 2013-09-28 | Payer: BC Managed Care – PPO | Source: Ambulatory Visit

## 2013-09-28 LAB — RH IG WORKUP (INCLUDES ABO/RH)
ABO/RH(D): O NEG
FETAL SCREEN: NEGATIVE
Gestational Age(Wks): 39.8
UNIT DIVISION: 0

## 2013-09-28 MED ORDER — IBUPROFEN 600 MG PO TABS: 600.0000 mg | ORAL_TABLET | Freq: Four times a day (QID) | ORAL | Status: DC

## 2013-09-28 MED ORDER — OXYCODONE-ACETAMINOPHEN 5-325 MG PO TABS: 1.0000 | ORAL_TABLET | ORAL | Status: DC | PRN

## 2013-09-28 NOTE — Discharge Summary (Signed)
Obstetric Discharge Summary Reason for Admission: onset of labor Prenatal Procedures: ultrasound Intrapartum Procedures: spontaneous vaginal delivery Postpartum Procedures: none Complications-Operative and Postpartum: 2 degree perineal laceration Hemoglobin  Date Value Ref Range Status  09/27/2013 12.6  12.0 - 15.0 Kerri [name]/dL Final     HCT  Date Value Ref Range Status  09/27/2013 36.1  36.0 - 46.0 % Final    Physical Exam:  General: alert and cooperative Lochia: appropriate Uterine Fundus: firm Incision: perineum intact DVT Evaluation: No evidence of DVT seen on physical exam. Negative Homan's sign. No cords or calf tenderness. No significant calf/ankle edema.  Discharge Diagnoses: Term Pregnancy-delivered  Discharge Information: Date: 09/28/2013 Activity: pelvic rest Diet: routine Medications: PNV, Ibuprofen and Percocet Condition: stable Instructions: refer to practice specific booklet Discharge to: home   Newborn Data: Live born female  Birth Weight: 7 lb 9.8 oz (3453 Kerri [name]) APGAR: 9, 9  Home with mother.  Kerri Kerri [name] 09/28/2013, 8:07 AM

## 2013-09-28 NOTE — Lactation Note (Signed)
This note was copied from the chart of Girl Imogene Burnamela Rafuse. Lactation Consultation Note  Patient Name: Girl Imogene Burnamela Hines EAVWU'JToday's Date: 09/28/2013 Reason for consult: Follow-up assessment Rented a insurance flat rate DEBP , with instructions. Mom [facility] dad [facility] careMother informed of post-discharge support and given phone number to the lactation department, including services for phone call assistance; out-patient appointments; and breastfeeding support group. List of other breastfeeding resources in the community given in the handout. Encouraged mother to call for problems or concerns related to breastfeeding.   Maternal Data Formula Feeding for Exclusion: No Has patient been taught Hand Expression?: Yes Does the patient have breastfeeding experience prior to this delivery?: No  Feeding Feeding Type:  (per mom last feeding at 0900 from  a bottle ) Nipple Type: Slow - flow  LATCH Score/Interventions                Intervention(s): Breastfeeding basics reviewed     Lactation Tools Discussed/Used Tools: Shells;Pump;Comfort gels Shell Type: Inverted Breast pump type: Double-Electric Breast Pump (rented a DEBP with instructions ) WIC Program: No Pump Review: Milk Storage;Setup, frequency, and cleaning Initiated by:: MAI  Date initiated:: 09/28/13   Consult Status Consult Status: Complete Date: 09/28/13 Follow-up type: In-patient    Kathrin Greathouseorio, Esmay Amspacher Ann 09/28/2013, 12:14 PM

## 2013-09-28 NOTE — Lactation Note (Signed)
This note was copied from the chart of Kerri [name]. Lactation Consultation Note  Patient Name: Kerri Kerri [name] ZOXWR'UToday's Date: 09/28/2013 Reason for consult: Follow-up assessment Per mom to sore to latch so we started supplement during the night. With moms permission assessed breast tissue , nipples , and portions of the areolas pink red and with bruising. Per mom tender, also LC noted some areola edema both breast . LC suspects baby has been sliding to the nipples. LC made 2 recommendations, 1) keeping the baby at the breast latching - prior to latch - breast massage , hand express,  pre- pump with hand pump,reverse pressure, latch with firm support and breast compressions until comfortable and then intermittent. 2) give the nipples a vacation from  latching - and pump both breast every 3 hours and prn to establish and protect milk supply. And using a medium based nipple feed the baby until the soreness improves.  Mom and dad agreeable to rent a DEBP until insurance pump comes in.     Maternal Data Formula Feeding for Exclusion: No Has patient been taught Hand Expression?: Yes Does the patient have breastfeeding experience prior to this delivery?: No  Feeding Feeding Type:  (per mom last feeding at 0900 from  a bottle ) Nipple Type: Slow - flow  LATCH Score/Interventions                Intervention(s): Breastfeeding basics reviewed (see LC note )     Lactation Tools Discussed/Used Tools: Pump Breast pump type: Manual WIC Program: No Pump Review: Setup, frequency, and cleaning Initiated by:: MAI  Date initiated:: 09/28/13   Consult Status Consult Status: Follow-up (plans to rent a DEBP ) Date: 09/28/13 Follow-up type: In-patient    Kerri [name], Kerri [name] 09/28/2013, 10:36 AM

## 2013-10-02 ENCOUNTER — Ambulatory Visit (HOSPITAL_COMMUNITY)
Admission: RE | Admit: 2013-10-02 | Discharge: 2013-10-02 | Disposition: A | Discharge status: Home / Self Care | Payer: BC Managed Care – PPO | Source: Ambulatory Visit | Attending: Obstetrics and Gynecology | Admitting: Obstetrics and Gynecology

## 2013-10-02 NOTE — Lactation Note (Addendum)
    Lactation Consult  Mother's reason for visit: returning baby to breast Visit Type: Initial  Appointment Notes: see below Consult:  Initial Lactation Consultant:  Kerri [name]  __________________________________________[name]______________________________    ________________________________________________________________________  Mother's Name: Kerri [name] Type of delivery:   Breastfeeding Experience:  primip Maternal Medical Conditions:  None Maternal Medications:  ASA, IB, PNV  ________________________________________________________________________  Breastfeeding History (Post Discharge)  Frequency of breastfeeding: bottle-feeding only Duration of feeding:  N/A  Supplementation  Formula:  Volume 45ml Frequency: q2-3h        Brand: Soy  Breastmilk:  Baby has rec'd 3 BO of EBM  Method:  Bottle  Infant Intake and Output Assessment  Voids:  4-5 in 24 hrs.  Color:  Clear yellow Stools: 2-4 in 24 hrs.  Color:  Manson PasseyBrown  ________________________________________________________________________  Maternal Breast Assessment  Breast:  Full Nipple:  Erect _______________________________________________________________________ Feeding Assessment/Evaluation  Initial feeding assessment:  Infant's oral assessment:  WNL  Positioning:  Cross cradle Right breast   Lips flanged:  Yes.   Suck assessment:  Displays both  Tools:  Nipple shield 20 mm Instructed on use and cleaning of tool:  Yes.    Pre-feed weight:  3282g g  (7 lb. 3.8 oz.) Post-feed weight:  3292 g  Amount transferred:  10 ml, R breast, cross-cradle  Pre-feed weight:  3292g g   Post-feed weight: 3310  g  Amount transferred:  18 ml, L breast, cross-cradle   Pre-feed weight:  3310g   Post-feed weight: 3312g Amount transferred:  2 ml   Pre-feed weight:  3312g   Post-feed weight: 3314 Amount transferred:2 ml  Total amount transferred: 32 ml  At time of consult, Kerri [name] had not had a  feeding at the breast in 3 days.  Mom had also done minimal pumping.  Mom desired to put baby to the breast.  Mom's breasts were noted to be full, with the upper outer quadrant of her R breast almost engorged.   A nipple shield (size 20) was applied.  Kerri [name] took to it pretty well, helping to soften the hard parts of Mom's breast.  Kerri [name] went to breast 4 different times during consult, transferring 32mL.  B/c of length of time of feedings, parents aware that they can offer soy formula after feedings with the nipple shield.  (A size 24 nipple shield was attempted, but it t was too big for baby's mouth).   Mom aware that she can pump for 15-20 min/at a time, especially if her breasts become overly full.   Nipple shield use to be reevaluated at appt on Monday w/a f/u appt.

## 2013-10-05 ENCOUNTER — Ambulatory Visit (HOSPITAL_COMMUNITY): Payer: BC Managed Care – PPO

## 2013-12-13 ENCOUNTER — Encounter (HOSPITAL_BASED_OUTPATIENT_CLINIC_OR_DEPARTMENT_OTHER): Payer: Self-pay | Admitting: Emergency Medicine

## 2013-12-13 ENCOUNTER — Emergency Department (HOSPITAL_BASED_OUTPATIENT_CLINIC_OR_DEPARTMENT_OTHER)
Admission: EM | Admit: 2013-12-13 | Discharge: 2013-12-14 | Disposition: A | Discharge status: Home / Self Care | Payer: BC Managed Care – PPO | Attending: Emergency Medicine | Admitting: Emergency Medicine

## 2013-12-13 DIAGNOSIS — R102 Pelvic and perineal pain: Secondary | ICD-10-CM

## 2013-12-13 DIAGNOSIS — Z791 Long term (current) use of non-steroidal anti-inflammatories (NSAID): Secondary | ICD-10-CM | POA: Diagnosis not present

## 2013-12-13 DIAGNOSIS — R51 Headache: Secondary | ICD-10-CM | POA: Diagnosis not present

## 2013-12-13 DIAGNOSIS — Z8659 Personal history of other mental and behavioral disorders: Secondary | ICD-10-CM | POA: Diagnosis not present

## 2013-12-13 DIAGNOSIS — Z7982 Long term (current) use of aspirin: Secondary | ICD-10-CM | POA: Diagnosis not present

## 2013-12-13 DIAGNOSIS — N9489 Other specified conditions associated with female genital organs and menstrual cycle: Secondary | ICD-10-CM | POA: Diagnosis not present

## 2013-12-13 DIAGNOSIS — Z79899 Other long term (current) drug therapy: Secondary | ICD-10-CM | POA: Diagnosis not present

## 2013-12-13 DIAGNOSIS — Z8632 Personal history of gestational diabetes: Secondary | ICD-10-CM | POA: Diagnosis not present

## 2013-12-13 DIAGNOSIS — Z8619 Personal history of other infectious and parasitic diseases: Secondary | ICD-10-CM | POA: Diagnosis not present

## 2013-12-13 NOTE — ED Notes (Signed)
Pt reports that she has had a tampon stuck inside of her vagina x 2 days.

## 2013-12-13 NOTE — ED Provider Notes (Signed)
CSN: [id number]     Arrival date & time 12/13/13  2339 History   First MD Initiated Contact with Patient 12/13/13 2346     Chief Complaint  Patient presents with  . Foreign Body in Vagina     (Consider location/radiation/quality/duration/timing/severity/associated sxs/prior Treatment) Patient is a 33 y.o. female presenting with foreign body in vagina. The history is provided by the patient.  Foreign Body in Vagina This is a new problem. The current episode started today.   Kerri [name] is a 33 y.o. female who presents to the ED with possible retained tampon in the vagina. She states that this is the first period she has had since she delivered her baby and she has been using tampons and thinks she has one stuck in her vagina. She has tried to pull it out but it causes pain when she pull at the area. She denies any other problems.   Past Medical History  Diagnosis Date  . Anxiety   . History of HPV infection 2003  . Phlebitis and thrombophlebitis   . Blood clot in vein   . Rh negative status during pregnancy   . Polycystic ovarian syndrome   . Headache(784.0)   . Gestational diabetes    Past Surgical History  Procedure Laterality Date  . Gynecologic cryosurgery  2003   Family History  Problem Relation Age of Onset  . Cancer Maternal Grandmother     lung  . Kidney disease Paternal Grandfather   . Hypertension Father   . Hypertension Brother   . Hypertension Paternal Grandmother    History  Substance Use Topics  . Smoking status: Never Smoker   . Smokeless tobacco: Never Used  . Alcohol Use: 0.0 oz/week     Comment: X 2 a wk   OB History   Grav Para Term Preterm Abortions TAB SAB Ect Mult Living   1 1 1  0 0  0 0 0 1     Review of Systems Negative except as stated in HPI   Allergies  Review of patient's allergies indicates no known allergies.  Home Medications   Prior to Admission medications   Medication Sig Start Date End Date Taking? Authorizing Provider   aspirin 81 MG chewable tablet Chew 81 mg by mouth daily.    Historical Provider, MD  ibuprofen (ADVIL,MOTRIN) 600 MG tablet Take 1 tablet (600 mg total) by mouth every 6 (six) hours. 09/28/13   Judith Blonderarol G Curtis, NP  oxyCODONE-acetaminophen (PERCOCET/ROXICET) 5-325 MG per tablet Take 1-2 tablets by mouth every 4 (four) hours as needed for severe pain (moderate - severe pain). 09/28/13   Judith Blonderarol G Curtis, NP  Prenatal Vit-Fe Fumarate-FA (PRENATAL MULTIVITAMIN) TABS Take 1 tablet by mouth daily.    Historical Provider, MD   There were no vitals taken for this visit. Physical Exam  Nursing note and vitals reviewed. Constitutional: She is oriented to person, place, and time. She appears well-developed and well-nourished.  HENT:  Head: Normocephalic and atraumatic.  Eyes: EOM are normal.  Neck: Neck supple.  Cardiovascular: Normal rate.   Pulmonary/Chest: Effort normal.  Abdominal: Soft. There is no tenderness.  Genitourinary:  External genitalia without lesions, moderate blood vaginal vault. Cleaned vault with large cotton tip applicator. Cervix visualized and vaginal vault. No foreign body visualized. When doing bimanual exam and touching the cervix the patient identifies that as the area she thought was a tampon.   Musculoskeletal: Normal range of motion.  Neurological: She is alert and oriented to person, place,  and time. No cranial nerve deficit.  Skin: Skin is warm and dry.  Psychiatric: She has a normal mood and affect. Her behavior is normal.    ED Course  Procedures   MDM  33 y.o. female here for possible foreign body in the vagina. Examined and no foreign body identified. Discussed with the patient clinical findings and plan of care and all questioned fully answered. She will return if any problems arise.      [facility] Orlene Och, [place] 12/14/13 [phone]

## 2013-12-14 NOTE — ED Provider Notes (Signed)
Medical screening examination/treatment/procedure(s) were performed by non-physician practitioner and as supervising physician I was immediately available for consultation/collaboration.   EKG Interpretation None       Ethelda ChickMartha K Linker, MD 12/14/13 [phone]

## 2013-12-14 NOTE — Discharge Instructions (Signed)
Your exam tonight does not show any tampon or other foreign body in the vagina. Follow up with her doctor as needed or return here as needed.

## 2014-01-11 ENCOUNTER — Encounter (HOSPITAL_BASED_OUTPATIENT_CLINIC_OR_DEPARTMENT_OTHER): Payer: Self-pay | Admitting: Emergency Medicine

## 2014-04-22 ENCOUNTER — Emergency Department (HOSPITAL_BASED_OUTPATIENT_CLINIC_OR_DEPARTMENT_OTHER): Payer: BLUE CROSS/BLUE SHIELD

## 2014-04-22 ENCOUNTER — Encounter (HOSPITAL_BASED_OUTPATIENT_CLINIC_OR_DEPARTMENT_OTHER): Payer: Self-pay | Admitting: *Deleted

## 2014-04-22 ENCOUNTER — Emergency Department (HOSPITAL_BASED_OUTPATIENT_CLINIC_OR_DEPARTMENT_OTHER)
Admission: EM | Admit: 2014-04-22 | Discharge: 2014-04-22 | Disposition: A | Discharge status: Home / Self Care | Payer: BLUE CROSS/BLUE SHIELD | Attending: Emergency Medicine | Admitting: Emergency Medicine

## 2014-04-22 DIAGNOSIS — Z8639 Personal history of other endocrine, nutritional and metabolic disease: Secondary | ICD-10-CM | POA: Diagnosis not present

## 2014-04-22 DIAGNOSIS — S0993XA Unspecified injury of face, initial encounter: Secondary | ICD-10-CM | POA: Diagnosis present

## 2014-04-22 DIAGNOSIS — Z8632 Personal history of gestational diabetes: Secondary | ICD-10-CM | POA: Insufficient documentation

## 2014-04-22 DIAGNOSIS — W01198A Fall on same level from slipping, tripping and stumbling with subsequent striking against other object, initial encounter: Secondary | ICD-10-CM | POA: Diagnosis not present

## 2014-04-22 DIAGNOSIS — Z8619 Personal history of other infectious and parasitic diseases: Secondary | ICD-10-CM | POA: Insufficient documentation

## 2014-04-22 DIAGNOSIS — Z86718 Personal history of other venous thrombosis and embolism: Secondary | ICD-10-CM | POA: Diagnosis not present

## 2014-04-22 DIAGNOSIS — Y9389 Activity, other specified: Secondary | ICD-10-CM | POA: Insufficient documentation

## 2014-04-22 DIAGNOSIS — Z8659 Personal history of other mental and behavioral disorders: Secondary | ICD-10-CM | POA: Insufficient documentation

## 2014-04-22 DIAGNOSIS — Y9289 Other specified places as the place of occurrence of the external cause: Secondary | ICD-10-CM | POA: Insufficient documentation

## 2014-04-22 DIAGNOSIS — S0083XA Contusion of other part of head, initial encounter: Secondary | ICD-10-CM | POA: Insufficient documentation

## 2014-04-22 DIAGNOSIS — Y998 Other external cause status: Secondary | ICD-10-CM | POA: Insufficient documentation

## 2014-04-22 NOTE — ED Provider Notes (Signed)
CSN: [id number]     Arrival date & time 04/22/14  2113 History  This chart was scribed for Tilden FossaElizabeth Liller Yohn, MD by Freida Busmaniana Omoyeni, ED Scribe. This patient was seen in room MH04/MH04 and the patient's care was started 9:39 PM.    Chief Complaint  Patient presents with  . Facial Injury     The history is provided by the patient. No language interpreter was used.     HPI Comments:  Kerri [name] is a 34 y.o. female who presents to the Emergency Department s/p fall 2 days ago complaining of moderate  pressure to her left Hauswirth and around her left eye following the incident. Pt states she slipped in a puddle of water and fell hitting the left side of her face on the ground. She broke her tooth and had a temporary crown placed yesterday. She denies LOC, dizziness, vision changes, nausea and vomiting. She has been taking 600mg  ibuprofen with little relief. Pain is constant and nonradiating.    Past Medical History  Diagnosis Date  . Anxiety   . History of HPV infection 2003  . Phlebitis and thrombophlebitis   . Blood clot in vein   . Rh negative status during pregnancy   . Polycystic ovarian syndrome   . Headache(784.0)   . Gestational diabetes    Past Surgical History  Procedure Laterality Date  . Gynecologic cryosurgery  2003   Family History  Problem Relation Age of Onset  . Cancer Maternal Grandmother     lung  . Kidney disease Paternal Grandfather   . Hypertension Father   . Hypertension Brother   . Hypertension Paternal Grandmother    History  Substance Use Topics  . Smoking status: Never Smoker   . Smokeless tobacco: Never Used  . Alcohol Use: 0.0 oz/week     Comment: X 2 a wk   OB History    Gravida Para Term Preterm AB TAB SAB Ectopic Multiple Living   1 1 1  0 0  0 0 0 1     Review of Systems  Eyes: Negative for visual disturbance.  Gastrointestinal: Negative for nausea and vomiting.  Musculoskeletal: Positive for myalgias.  All other systems reviewed and are  negative.     Allergies  Review of patient's allergies indicates no known allergies.  Home Medications   Prior to Admission medications   Not on File   BP 140/97 mmHg  Pulse 87  Temp(Src) 98.7 F (37.1 C) (Oral)  Resp 20  Ht 5\' 6"  (1.676 m)  Wt 205 lb (92.987 kg)  BMI 33.10 kg/m2  SpO2 100%  LMP 04/01/2014 Physical Exam  Constitutional: She is oriented to person, place, and time. She appears well-developed and well-nourished.  HENT:  Head: Normocephalic and atraumatic.  Right Ear: External ear normal. No hemotympanum.  Left Ear: External ear normal. No hemotympanum.  Eyes: EOM are normal. Pupils are equal, round, and reactive to light.  Mild erythema over the left eyebrow, mild left maxillary tenderness to palpation without visible deformity.  Cardiovascular: Normal rate and regular rhythm.   No murmur heard. Pulmonary/Chest: Effort normal and breath sounds normal. No respiratory distress.  Abdominal: Soft. There is no tenderness. There is no rebound and no guarding.  Musculoskeletal: She exhibits no edema.  Ecchymosis over left elbow and right knee with preserved range of motion  Neurological: She is alert and oriented to person, place, and time.  Skin: Skin is warm and dry.  Psychiatric: She has a normal mood and  affect. Her behavior is normal.  Nursing note and vitals reviewed.   ED Course  Procedures   DIAGNOSTIC STUDIES:  Oxygen Saturation is 100% on RA, normal by my interpretation.    COORDINATION OF CARE:  9:51 PM Discussed risk vs benefits of a head CT due to low suspicion for brain bleed, pt agrees to defer. Will order pain meds. Discussed treatment plan with pt at bedside and pt agreed to plan.  9:54 PM Pt has changed her mind and would like to have a head CT.   Labs Review Labs Reviewed - No data to display  Imaging Review Ct Head Wo Contrast  04/22/2014   CLINICAL DATA:  Slipped on water in kitchen; hit head and face on floor. Forehead pain.  Initial encounter.  EXAM: CT HEAD WITHOUT CONTRAST  TECHNIQUE: Contiguous axial images were obtained from the base of the skull through the vertex without intravenous contrast.  COMPARISON:  None.  FINDINGS: There is no evidence of acute infarction, mass lesion, or intra- or extra-axial hemorrhage on CT.  The posterior fossa, including the cerebellum, brainstem and fourth ventricle, is within normal limits. The third and lateral ventricles, and basal ganglia are unremarkable in appearance. The cerebral hemispheres are symmetric in appearance, with normal gray-white differentiation. No mass effect or midline shift is seen.  There is no evidence of fracture; visualized osseous structures are unremarkable in appearance. The orbits are within normal limits. The paranasal sinuses and mastoid air cells are well-aerated. Minimal soft tissue swelling is suggested overlying the frontal calvarium.  IMPRESSION: 1. No evidence of traumatic intracranial injury or fracture. 2. Minimal soft tissue swelling suggested overlying the frontal calvarium.   Electronically Signed   By: Roanna Raider M.D.   On: 04/22/2014 22:17     EKG Interpretation None      MDM   Final diagnoses:  Facial contusion, initial encounter    Patient here for evaluation of headache following a fall and head injury 2 days ago. There is a low index of suspicion for serious intracranial abnormality. CT scan negative for acute intracranial abnormality. Discussed with patient PCP follow-up as well as return precautions.  I personally performed the services described in this documentation, which was scribed in my presence. The recorded information has been reviewed and is accurate.    Tilden Fossa, MD 04/22/14 [phone]

## 2014-04-22 NOTE — Discharge Instructions (Signed)
Facial or Scalp Contusion A facial or scalp contusion is a deep bruise on the face or head. Injuries to the face and head generally cause a lot of swelling, especially around the eyes. Contusions are the result of an injury that caused bleeding under the skin. The contusion may turn blue, purple, or yellow. Minor injuries will give you a painless contusion, but more severe contusions may stay painful and swollen for a few weeks.  CAUSES  A facial or scalp contusion is caused by a blunt injury or trauma to the face or head area.  SIGNS AND SYMPTOMS   Swelling of the injured area.   Discoloration of the injured area.   Tenderness, soreness, or pain in the injured area.  DIAGNOSIS  The diagnosis can be made by taking a medical history and doing a physical exam. An X-ray exam, CT scan, or MRI may be needed to determine if there are any associated injuries, such as broken bones (fractures). TREATMENT  Often, the best treatment for a facial or scalp contusion is applying cold compresses to the injured area. Over-the-counter medicines may also be recommended for pain control.  HOME CARE INSTRUCTIONS   Only take over-the-counter or prescription medicines as directed by your health care provider.   Apply ice to the injured area.   Put ice in a plastic bag.   Place a towel between your skin and the bag.   Leave the ice on for 20 minutes, 2-3 times a day.  SEEK MEDICAL CARE IF:  You have bite problems.   You have pain with chewing.   You are concerned about facial defects. SEEK IMMEDIATE MEDICAL CARE IF:  You have severe pain or a headache that is not relieved by medicine.   You have unusual sleepiness, confusion, or personality changes.   You throw up (vomit).   You have a persistent nosebleed.   You have double vision or blurred vision.   You have fluid drainage from your nose or ear.   You have difficulty walking or using your arms or legs.  MAKE SURE YOU:    Understand these instructions.  Will watch your condition.  Will get help right away if you are not doing well or get worse. Document Released: 04/05/2004 Document Revised: 12/17/2012 Document Reviewed: 10/09/2012 [employer] Patient Information 2015 [employer]. This information is not intended to replace advice given to you by your health care provider. Make sure you discuss any questions you have with your health care provider.  

## 2014-04-22 NOTE — ED Notes (Signed)
She fell 2 days ago. She knocked her tooth out and has a temporary crown. Here today for facial pain and pressure behind her left eye. No LOC.

## 2014-04-22 NOTE — ED Notes (Signed)
Fell 2 days ago hit face  No loc  Now c/o pressure around left eye  Pt a&o  No n/v  No blurred vision

## 2014-04-22 NOTE — ED Notes (Signed)
Patient transported to CT 

## 2014-11-07 ENCOUNTER — Inpatient Hospital Stay (HOSPITAL_COMMUNITY)
Admission: AD | Admit: 2014-11-07 | Discharge: 2014-11-07 | Disposition: A | Discharge status: Home / Self Care | Payer: BLUE CROSS/BLUE SHIELD | Source: Ambulatory Visit | Attending: Obstetrics and Gynecology | Admitting: Obstetrics and Gynecology

## 2014-11-07 ENCOUNTER — Inpatient Hospital Stay (HOSPITAL_COMMUNITY): Payer: BLUE CROSS/BLUE SHIELD

## 2014-11-07 ENCOUNTER — Encounter (HOSPITAL_COMMUNITY): Payer: Self-pay | Admitting: *Deleted

## 2014-11-07 DIAGNOSIS — N831 Corpus luteum cyst of ovary, unspecified side: Secondary | ICD-10-CM

## 2014-11-07 DIAGNOSIS — R109 Unspecified abdominal pain: Secondary | ICD-10-CM

## 2014-11-07 DIAGNOSIS — R1031 Right lower quadrant pain: Secondary | ICD-10-CM | POA: Insufficient documentation

## 2014-11-07 DIAGNOSIS — O219 Vomiting of pregnancy, unspecified: Secondary | ICD-10-CM | POA: Diagnosis not present

## 2014-11-07 DIAGNOSIS — Z3A01 Less than 8 weeks gestation of pregnancy: Secondary | ICD-10-CM | POA: Insufficient documentation

## 2014-11-07 DIAGNOSIS — O210 Mild hyperemesis gravidarum: Secondary | ICD-10-CM | POA: Diagnosis not present

## 2014-11-07 DIAGNOSIS — O26899 Other specified pregnancy related conditions, unspecified trimester: Secondary | ICD-10-CM

## 2014-11-07 DIAGNOSIS — O3481 Maternal care for other abnormalities of pelvic organs, first trimester: Secondary | ICD-10-CM

## 2014-11-07 DIAGNOSIS — O9989 Other specified diseases and conditions complicating pregnancy, childbirth and the puerperium: Secondary | ICD-10-CM

## 2014-11-07 DIAGNOSIS — O26891 Other specified pregnancy related conditions, first trimester: Secondary | ICD-10-CM | POA: Diagnosis not present

## 2014-11-07 LAB — URINALYSIS, ROUTINE W REFLEX MICROSCOPIC
BILIRUBIN URINE: NEGATIVE
Glucose, UA: NEGATIVE mg/dL
HGB URINE DIPSTICK: NEGATIVE
KETONES UR: NEGATIVE mg/dL
Leukocytes, UA: NEGATIVE
NITRITE: NEGATIVE
PROTEIN: NEGATIVE mg/dL
Specific Gravity, Urine: >1.03 — ABNORMAL HIGH (ref 1.005–1.030)
UROBILINOGEN UA: 0.2 mg/dL (ref 0.0–1.0)
pH: 5.5 (ref 5.0–8.0)

## 2014-11-07 LAB — CBC
HEMATOCRIT: 36.8 % (ref 36.0–46.0)
Hemoglobin: 13 g/dL (ref 12.0–15.0)
MCH: 30.1 pg (ref 26.0–34.0)
MCHC: 35.3 g/dL (ref 30.0–36.0)
MCV: 85.2 fL (ref 78.0–100.0)
PLATELETS: 213 10*3/uL (ref 150–400)
RBC: 4.32 MIL/uL (ref 3.87–5.11)
RDW: 12.7 % (ref 11.5–15.5)
WBC: 7.4 10*3/uL (ref 4.0–10.5)

## 2014-11-07 LAB — HCG, QUANTITATIVE, PREGNANCY: hCG, Beta Chain, Quant, S: 35956 m[IU]/mL — ABNORMAL HIGH (ref <5)

## 2014-11-07 LAB — POCT PREGNANCY, URINE: PREG TEST UR: POSITIVE — AB

## 2014-11-07 MED ORDER — PROMETHAZINE HCL 25 MG PO TABS: 25.0000 mg | ORAL_TABLET | Freq: Four times a day (QID) | ORAL | Status: DC | PRN

## 2014-11-07 MED ORDER — MECLIZINE HCL 25 MG PO TABS: 25.0000 mg | ORAL_TABLET | Freq: Three times a day (TID) | ORAL | Status: DC | PRN

## 2014-11-07 NOTE — Discharge Instructions (Signed)

## 2014-11-07 NOTE — MAU Note (Signed)
Pt presents to MAU with complaints of pain in her right lower side that started last night. Denies any vaginal bleeding or abnormal discharge

## 2014-11-07 NOTE — MAU Provider Note (Signed)
History     CSN: [id number]  Arrival date and time: 11/07/14 1600   None     Chief Complaint  Patient presents with  . Abdominal Pain  . Possible Pregnancy   HPI Pt is G2P1001 at [redacted]w[redacted]d pregnant who presents with RLQ pain onset last night and has gotten worse today. Pt has had nausea and vomiting with her pregnancy and does not think the nausea is related to the pain. Pt denies spotting or bleeding.  Pt has had 2 serial HCGs in the office with appropriate rise. Pt is worried about ectopic- has not had an ultrasound with confirmed IUP. Pt denies UTI sx or constipation or diarrhea RN note:  Nursing MAU Note 11/07/2014 4:29 PM    Expand All Collapse All   Pt presents to MAU with complaints of pain in her right lower side that started last night. Denies any vaginal bleeding or abnormal discharge       Past Medical History  Diagnosis Date  . Anxiety   . History of HPV infection 2003  . Phlebitis and thrombophlebitis   . Blood clot in vein   . Rh negative status during pregnancy   . Polycystic ovarian syndrome   . Headache(784.0)   . Gestational diabetes     Past Surgical History  Procedure Laterality Date  . Gynecologic cryosurgery  2003    Family History  Problem Relation Age of Onset  . Cancer Maternal Grandmother     lung  . Kidney disease Paternal Grandfather   . Hypertension Father   . Hypertension Brother   . Hypertension Paternal Grandmother     Social History  Substance Use Topics  . Smoking status: Never Smoker   . Smokeless tobacco: Never Used  . Alcohol Use: 0.0 oz/week     Comment: X 2 a wk    Allergies: No Known Allergies  Prescriptions prior to admission  Medication Sig Dispense Refill Last Dose  . aspirin EC 81 MG tablet Take 81 mg by mouth at bedtime.   11/06/2014 at Unknown time  . Doxylamine-Pyridoxine (DICLEGIS) 10-10 MG TBEC Take 2 tablets by mouth at bedtime as needed (for nausea/vomiting).   11/06/2014 at Unknown time  . Prenatal  Vit-Fe Fumarate-FA (PRENATAL MULTIVITAMIN) TABS tablet Take 1 tablet by mouth at bedtime.   11/06/2014 at Unknown time  . progesterone (PROMETRIUM) 200 MG capsule Take 200 mg by mouth at bedtime.   11/06/2014 at Unknown time    Review of Systems  Constitutional: Negative for fever and chills.  Gastrointestinal: Positive for nausea, vomiting and abdominal pain. Negative for diarrhea and constipation.  Genitourinary: Negative for dysuria and urgency.   Physical Exam   Blood pressure 125/77, pulse 100, temperature 98.3 F (36.8 C), resp. rate 18, last menstrual period 09/22/2014, unknown if currently breastfeeding.  Physical Exam  Constitutional: She is oriented to person, place, and time. She appears well-developed and well-nourished. No distress.  HENT:  Head: Normocephalic.  Eyes: Pupils are equal, round, and reactive to light.  Neck: Normal range of motion. Neck supple.  Cardiovascular: Normal rate.   Respiratory: Effort normal.  GI: Soft. She exhibits no distension. There is tenderness. There is no rebound and no guarding.  Genitourinary:  Vagina clean, cervix closed, NT; uterus enlarged 6 weeks size adnexa without palpable enlargement - mildly tender right adnexa - no rebound  Musculoskeletal: Normal range of motion.  Neurological: She is alert and oriented to person, place, and time.  Skin: Skin is warm and dry.  Psychiatric: She has a normal mood and affect.    MAU Course  Procedures US Ob Comp Less 14 Wks  11/07/2014   CLINICAL DATA:  Pregnant, right lower quadrant pain  EXAM: OBSTETRIC <14 WK [place] AND TRANSVAGINAL OB US  TECHNIQUE: Both transabdominal and transvaginal ultrasound examinations were performed for complete evaluation of the gestation as well as the maternal uterus, adnexal regions, and pelvic cul-de-sac. Transvaginal technique was performed to assess early pregnancy.  COMPARISON:  None.  FINDINGS: Intrauterine gestational sac: Visualized/normal in shape.  Yolk sac:   Present  Embryo:  Present  Cardiac Activity: Present  Heart Rate: 131  bpm  CRL:  5.3  mm   6 w   3 d                  [place] EDC: 06/30/2015  Maternal uterus/adnexae: No subchronic hemorrhage.  Bilateral ovaries are within normal limits, noting a right corpus luteal cyst.  No free fluid.  IMPRESSION: Single live intrauterine gestation with estimated gestational age [redacted] weeks 3 days by crown-rump length.   Electronically Signed   By: Charline Bills M.D.   On: 11/07/2014 17:32   US Ob Transvaginal  11/07/2014   CLINICAL DATA:  Pregnant, right lower quadrant pain  EXAM: OBSTETRIC <14 WK [place] AND TRANSVAGINAL OB US  TECHNIQUE: Both transabdominal and transvaginal ultrasound examinations were performed for complete evaluation of the gestation as well as the maternal uterus, adnexal regions, and pelvic cul-de-sac. Transvaginal technique was performed to assess early pregnancy.  COMPARISON:  None.  FINDINGS: Intrauterine gestational sac: Visualized/normal in shape.  Yolk sac:  Present  Embryo:  Present  Cardiac Activity: Present  Heart Rate: 131  bpm  CRL:  5.3  mm   6 w   3 d                  [place] EDC: 06/30/2015  Maternal uterus/adnexae: No subchronic hemorrhage.  Bilateral ovaries are within normal limits, noting a right corpus luteal cyst.  No free fluid.  IMPRESSION: Single live intrauterine gestation with estimated gestational age [redacted] weeks 3 days by crown-rump length.   Electronically Signed   By: Charline Bills M.D.   On: 11/07/2014 17:32   Reported to Dr. Vincente Poli  Assessment and Plan  Abdominal pain in pregnancy- first trimester- heating pad and tylenol prn Right CLC Nausea and vomiting in pregnancy- Rx Meclizine and phenergan F/u with OB appointment  [facility] 11/07/2014, 4:39 PM

## 2014-11-25 LAB — OB RESULTS CONSOLE RPR: RPR: NONREACTIVE

## 2014-11-25 LAB — OB RESULTS CONSOLE HEPATITIS B SURFACE ANTIGEN: Hepatitis B Surface Ag: NEGATIVE

## 2014-11-25 LAB — OB RESULTS CONSOLE GC/CHLAMYDIA
CHLAMYDIA, DNA PROBE: NEGATIVE
GC PROBE AMP, GENITAL: NEGATIVE

## 2014-11-25 LAB — OB RESULTS CONSOLE ABO/RH: RH Type: NEGATIVE

## 2014-11-25 LAB — OB RESULTS CONSOLE RUBELLA ANTIBODY, IGM: RUBELLA: IMMUNE

## 2014-11-25 LAB — OB RESULTS CONSOLE HIV ANTIBODY (ROUTINE TESTING): HIV: NONREACTIVE

## 2014-11-25 LAB — OB RESULTS CONSOLE ANTIBODY SCREEN: ANTIBODY SCREEN: NEGATIVE

## 2015-02-17 ENCOUNTER — Emergency Department (HOSPITAL_BASED_OUTPATIENT_CLINIC_OR_DEPARTMENT_OTHER)
Admission: EM | Admit: 2015-02-17 | Discharge: 2015-02-18 | Disposition: A | Discharge status: Home / Self Care | Payer: BLUE CROSS/BLUE SHIELD | Attending: Emergency Medicine | Admitting: Emergency Medicine

## 2015-02-17 ENCOUNTER — Encounter (HOSPITAL_BASED_OUTPATIENT_CLINIC_OR_DEPARTMENT_OTHER): Payer: Self-pay

## 2015-02-17 ENCOUNTER — Emergency Department (HOSPITAL_BASED_OUTPATIENT_CLINIC_OR_DEPARTMENT_OTHER): Payer: BLUE CROSS/BLUE SHIELD

## 2015-02-17 DIAGNOSIS — O9989 Other specified diseases and conditions complicating pregnancy, childbirth and the puerperium: Secondary | ICD-10-CM | POA: Diagnosis present

## 2015-02-17 DIAGNOSIS — M79652 Pain in left thigh: Secondary | ICD-10-CM | POA: Insufficient documentation

## 2015-02-17 DIAGNOSIS — Z8619 Personal history of other infectious and parasitic diseases: Secondary | ICD-10-CM | POA: Diagnosis not present

## 2015-02-17 DIAGNOSIS — Z79899 Other long term (current) drug therapy: Secondary | ICD-10-CM | POA: Diagnosis not present

## 2015-02-17 DIAGNOSIS — Z8639 Personal history of other endocrine, nutritional and metabolic disease: Secondary | ICD-10-CM | POA: Insufficient documentation

## 2015-02-17 DIAGNOSIS — Z3A21 21 weeks gestation of pregnancy: Secondary | ICD-10-CM | POA: Diagnosis not present

## 2015-02-17 DIAGNOSIS — Z8679 Personal history of other diseases of the circulatory system: Secondary | ICD-10-CM | POA: Diagnosis not present

## 2015-02-17 DIAGNOSIS — Z7982 Long term (current) use of aspirin: Secondary | ICD-10-CM | POA: Diagnosis not present

## 2015-02-17 DIAGNOSIS — Z8659 Personal history of other mental and behavioral disorders: Secondary | ICD-10-CM | POA: Insufficient documentation

## 2015-02-17 DIAGNOSIS — R600 Localized edema: Secondary | ICD-10-CM | POA: Diagnosis not present

## 2015-02-17 DIAGNOSIS — Z86718 Personal history of other venous thrombosis and embolism: Secondary | ICD-10-CM | POA: Insufficient documentation

## 2015-02-17 DIAGNOSIS — Z8632 Personal history of gestational diabetes: Secondary | ICD-10-CM | POA: Insufficient documentation

## 2015-02-17 DIAGNOSIS — R609 Edema, unspecified: Secondary | ICD-10-CM

## 2015-02-17 NOTE — ED Provider Notes (Signed)
CSN: [id number]     Arrival date & time 02/17/15  2236 History  By signing my name below, I, Soijett Blue, attest that this documentation has been prepared under the direction and in the presence of Shon Baton, MD. Electronically Signed: Soijett Blue, ED Scribe. 02/17/2015. 1:29 AM.   Chief Complaint  Patient presents with  . Leg Pain      The history is provided by the patient. No language interpreter was used.    Kerri [name] is a 34 y.o. female with a medical hx of SVT, gestational diabetes, who presents to the Emergency Department complaining of leg pain onset 4 weeks. She notes that there was a knot to back of her left upper leg that she was advised by her OB-GYN to come into the ED for further evaluation. She states that she is currently [redacted] weeks pregnant at this time. Pt is having associated symptoms of left upper thigh pain. She notes that she has not tried any medications for the relief of her symptoms. She denies SOB, CP, leg swelling, abdominal pain, and any other symptoms. She states that this is her second pregnancy and that she has had bulging veins in her last pregnancy. She notes that she takes 81 mg ASA daily due to her PMHx of SVT.    Past Medical History  Diagnosis Date  . Anxiety   . History of HPV infection 2003  . Phlebitis and thrombophlebitis   . Blood clot in vein   . Rh negative status during pregnancy   . Polycystic ovarian syndrome   . Headache(784.0)   . Gestational diabetes    Past Surgical History  Procedure Laterality Date  . Gynecologic cryosurgery  2003   Family History  Problem Relation Age of Onset  . Cancer Maternal Grandmother     lung  . Kidney disease Paternal Grandfather   . Hypertension Father   . Hypertension Brother   . Hypertension Paternal Grandmother    Social History  Substance Use Topics  . Smoking status: Never Smoker   . Smokeless tobacco: Never Used  . Alcohol Use: No   OB History    Gravida Para Term Preterm AB  TAB SAB Ectopic Multiple Living   0 0  0 0 0 1     Review of Systems  Respiratory: Negative for shortness of breath.   Cardiovascular: Negative for chest pain and leg swelling.  Gastrointestinal: Negative for abdominal pain.  Musculoskeletal:       Knot on left leg  Skin: Positive for color change.  All other systems reviewed and are negative.     Allergies  Review of patient's allergies indicates no known allergies.  Home Medications   Prior to Admission medications   Medication Sig Start Date End Date Taking? Authorizing Provider  aspirin EC 81 MG tablet Take 81 mg by mouth at bedtime.    Historical Provider, MD  Doxylamine-Pyridoxine (DICLEGIS) 10-10 MG TBEC Take 2 tablets by mouth at bedtime as needed (for nausea/vomiting).    Historical Provider, MD  meclizine (ANTIVERT) 25 MG tablet Take 1 tablet (25 mg total) by mouth 3 (three) times daily as needed for dizziness. 11/07/14   Jean Rosenthal, NP  Prenatal Vit-Fe Fumarate-FA (PRENATAL MULTIVITAMIN) TABS tablet Take 1 tablet by mouth at bedtime.    Historical Provider, MD  promethazine (PHENERGAN) 25 MG tablet Take 1 tablet (25 mg total) by mouth every 6 (six) hours as needed for nausea or vomiting. 11/07/14  Jean RosenthalSusan P Lineberry, NP   BP 129/83 mmHg  Pulse 95  Temp(Src) 98.3 F (36.8 C) (Oral)  Resp 18  Ht 5\' 6"  (1.676 m)  Wt 230 lb (104.327 kg)  BMI 37.14 kg/m2  SpO2 100%  LMP 09/22/2014 Physical Exam  Constitutional: She is oriented to person, place, and time. She appears well-developed and well-nourished. No distress.  HENT:  Head: Normocephalic and atraumatic.  Cardiovascular: Normal rate, regular rhythm and normal heart sounds.   No murmur heard. Pulmonary/Chest: Effort normal and breath sounds normal. No respiratory distress. She has no wheezes.  Abdominal: Soft. Bowel sounds are normal. There is no tenderness. There is no rebound.  Gravid to the umbilicus  Musculoskeletal:  1+ symmetric bilateral lower  extremity edema, small 1 cm nodule palpated over the left lateral thigh, no overlying skin changes or erythema, no significant tenderness  Neurological: She is alert and oriented to person, place, and time.  Skin: Skin is warm and dry.  Significant bilateral lower extremity varicose veins  Psychiatric: She has a normal mood and affect.  Nursing note and vitals reviewed.   ED Course  Procedures (including critical care time) DIAGNOSTIC STUDIES: Oxygen Saturation is 100% on RA, nl by my interpretation.    COORDINATION OF CARE: 11:42 PM Discussed treatment plan with pt at bedside and pt agreed to plan.    Labs Review Labs Reviewed - No data to display  Imaging Review [place] Venous Img Lower Unilateral Left  02/18/2015  CLINICAL DATA:  Knot at the left lateral thigh, with swelling along the left lower extremity, acute onset. Initial encounter. EXAM: LEFT LOWER EXTREMITY VENOUS DOPPLER ULTRASOUND TECHNIQUE: Gray-scale sonography with graded compression, as well as color Doppler and duplex ultrasound were performed to evaluate the lower extremity deep venous systems from the level of the common femoral vein and including the common femoral, femoral, profunda femoral, popliteal and calf veins including the posterior tibial, peroneal and gastrocnemius veins when visible. The superficial great saphenous vein was also interrogated. Spectral Doppler was utilized to evaluate flow at rest and with distal augmentation maneuvers in the common femoral, femoral and popliteal veins. COMPARISON:  None. FINDINGS: Contralateral Common Femoral Vein: Respiratory phasicity is normal and symmetric with the symptomatic side. No evidence of thrombus. Normal compressibility. Common Femoral Vein: No evidence of thrombus. Normal compressibility, respiratory phasicity and response to augmentation. Saphenofemoral Junction: No evidence of thrombus. Normal compressibility and flow on color Doppler imaging. Profunda Femoral Vein:  No evidence of thrombus. Normal compressibility and flow on color Doppler imaging. Femoral Vein: No evidence of thrombus. Normal compressibility, respiratory phasicity and response to augmentation. Popliteal Vein: No evidence of thrombus. Normal compressibility, respiratory phasicity and response to augmentation. Calf Veins: No evidence of thrombus. Normal compressibility and flow on color Doppler imaging. Superficial Great Saphenous Vein: No evidence of thrombus. Normal compressibility and flow on color Doppler imaging. Venous Reflux:  None. Other Findings:  None. IMPRESSION: No evidence of deep venous thrombosis. Electronically Signed   By: Roanna RaiderJeffery  Chang M.D.   On: 02/18/2015 00:56   I have personally reviewed and evaluated these images as part of my medical decision-making.   EKG Interpretation None      MDM   Final diagnoses:  Swelling    Patient presents with a knot to the left leg and swelling. OB concern for DVT. Patient is at higher risk as she is pregnant. She also reports a history of thrombophlebitis and "vein issues". She takes an aspirin daily for this. Physical  exam is largely unremarkable. She has a small knot on her left thigh. No evidence of infection or abscess. Discussed with the patient that screening test for blood clot would likely be of little utility given her pregnancy. D-dimer would likely be positive. Patient has elected to proceed with ultrasound imaging which is reasonable given risk factors. Ultrasound negative for DVT.  After history, exam, and medical workup I feel the patient has been appropriately medically screened and is safe for discharge home. Pertinent diagnoses were discussed with the patient. Patient was given return precautions.  I personally performed the services described in this documentation, which was scribed in my presence. The recorded information has been reviewed and is accurate.    Shon Baton, MD 02/18/15 [phone]

## 2015-02-17 NOTE — ED Notes (Signed)
C/o "knot" to left upper leg x "few weeks"-denies injury-states was advised by OB to come in to r/o blood clot-pt is [redacted] weeks pregnant

## 2015-02-17 NOTE — ED Notes (Signed)
MD at bedside. 

## 2015-02-18 NOTE — ED Notes (Signed)
Pt returned from US

## 2015-02-18 NOTE — Discharge Instructions (Signed)
You were seen today for evaluation for a knot on her left leg. Your ultrasound study is negative. It is unclear the origin of this not; however, given that it is been there for approximately one month, it is unlikely to be an abscess, infection, or phlebitis. Follow-up with your OB as needed.

## 2015-02-18 NOTE — ED Notes (Signed)
MD at bedside. 

## 2015-03-09 ENCOUNTER — Encounter (HOSPITAL_COMMUNITY): Payer: Self-pay

## 2015-03-09 ENCOUNTER — Inpatient Hospital Stay (HOSPITAL_COMMUNITY)
Admission: AD | Admit: 2015-03-09 | Discharge: 2015-03-10 | Disposition: A | Discharge status: Home / Self Care | Payer: BLUE CROSS/BLUE SHIELD | Source: Ambulatory Visit | Attending: Obstetrics and Gynecology | Admitting: Obstetrics and Gynecology

## 2015-03-09 DIAGNOSIS — E282 Polycystic ovarian syndrome: Secondary | ICD-10-CM | POA: Insufficient documentation

## 2015-03-09 DIAGNOSIS — B373 Candidiasis of vulva and vagina: Secondary | ICD-10-CM | POA: Insufficient documentation

## 2015-03-09 DIAGNOSIS — F419 Anxiety disorder, unspecified: Secondary | ICD-10-CM | POA: Insufficient documentation

## 2015-03-09 DIAGNOSIS — R109 Unspecified abdominal pain: Secondary | ICD-10-CM | POA: Diagnosis not present

## 2015-03-09 DIAGNOSIS — Z7982 Long term (current) use of aspirin: Secondary | ICD-10-CM | POA: Insufficient documentation

## 2015-03-09 DIAGNOSIS — R102 Pelvic and perineal pain: Secondary | ICD-10-CM | POA: Insufficient documentation

## 2015-03-09 DIAGNOSIS — Z3A24 24 weeks gestation of pregnancy: Secondary | ICD-10-CM | POA: Diagnosis not present

## 2015-03-09 DIAGNOSIS — O9989 Other specified diseases and conditions complicating pregnancy, childbirth and the puerperium: Secondary | ICD-10-CM | POA: Diagnosis not present

## 2015-03-09 DIAGNOSIS — B3731 Acute candidiasis of vulva and vagina: Secondary | ICD-10-CM

## 2015-03-09 DIAGNOSIS — O98812 Other maternal infectious and parasitic diseases complicating pregnancy, second trimester: Secondary | ICD-10-CM | POA: Diagnosis not present

## 2015-03-09 DIAGNOSIS — O26899 Other specified pregnancy related conditions, unspecified trimester: Secondary | ICD-10-CM

## 2015-03-09 LAB — URINALYSIS, ROUTINE W REFLEX MICROSCOPIC
Bilirubin Urine: NEGATIVE
GLUCOSE, UA: NEGATIVE mg/dL
HGB URINE DIPSTICK: NEGATIVE
Ketones, ur: NEGATIVE mg/dL
Leukocytes, UA: NEGATIVE
Nitrite: NEGATIVE
PH: 6 (ref 5.0–8.0)
Protein, ur: NEGATIVE mg/dL
SPECIFIC GRAVITY, URINE: 1.01 (ref 1.005–1.030)

## 2015-03-09 NOTE — MAU Provider Note (Signed)
History     CSN: [id number]  Arrival date and time: 03/09/15 2241   First Provider Initiated Contact with Patient 03/09/15 2323       Chief Complaint  Patient presents with  . Vaginal Pain  . Abdominal Cramping   HPI  Allysson Rinehimer is a 34 y.o. G2P1001 at 110w1d who presents for abdominal cramping, vaginal pain, & vaginal irritation.  Symptoms started today at the end of her work shift. Reports some abdominal cramping & intermittent pains at "her cervix". Denies vaginal bleeding or discharge.  Denies n/v/d/constipation, vaginal bleeding, or LOF.  Denies recent intercourse.  Also reports some vaginal itching & irritation. States she was treated for yeast infection last month & she feels like it's back.  Positive fetal movement.    OB History    Gravida Para Term Preterm AB TAB SAB Ectopic Multiple Living   0 0  0 0 0 1      Past Medical History  Diagnosis Date  . Anxiety   . History of HPV infection 2003  . Phlebitis and thrombophlebitis   . Blood clot in vein   . Rh negative status during pregnancy   . Polycystic ovarian syndrome   . Headache(784.0)   . Gestational diabetes     Past Surgical History  Procedure Laterality Date  . Gynecologic cryosurgery  2003    Family History  Problem Relation Age of Onset  . Cancer Maternal Grandmother     lung  . Kidney disease Paternal Grandfather   . Hypertension Father   . Hypertension Brother   . Hypertension Paternal Grandmother     Social History  Substance Use Topics  . Smoking status: Never Smoker   . Smokeless tobacco: Never Used  . Alcohol Use: No    Allergies: No Known Allergies  Prescriptions prior to admission  Medication Sig Dispense Refill Last Dose  . aspirin EC 81 MG tablet Take 81 mg by mouth at bedtime.   03/08/2015 at Unknown time  . Doxylamine-Pyridoxine (DICLEGIS) 10-10 MG TBEC Take 2 tablets by mouth at bedtime as needed (for nausea/vomiting).   Past Month at Unknown time  . Prenatal  Vit-Fe Fumarate-FA (PRENATAL MULTIVITAMIN) TABS tablet Take 1 tablet by mouth at bedtime.   03/08/2015 at Unknown time  . meclizine (ANTIVERT) 25 MG tablet Take 1 tablet (25 mg total) by mouth 3 (three) times daily as needed for dizziness. 30 tablet 0   . promethazine (PHENERGAN) 25 MG tablet Take 1 tablet (25 mg total) by mouth every 6 (six) hours as needed for nausea or vomiting. 30 tablet 0     Review of Systems  Constitutional: Negative.   Gastrointestinal: Positive for abdominal pain. Negative for nausea, vomiting, diarrhea and constipation.  Genitourinary: Negative for dysuria.       + vaginal irritation No vaginal bleeding or discharge   Physical Exam   Blood pressure 125/70, pulse 91, temperature 98.7 F (37.1 C), temperature source Oral, resp. rate 18, height  (1.676 m), weight 246 lb (111.585 kg), last menstrual period 09/22/2014, SpO2 98 %, unknown if currently breastfeeding.  Physical Exam  Nursing note and vitals reviewed. Constitutional: She is oriented to person, place, and time. She appears well-developed and well-nourished. No distress.  HENT:  Head: Normocephalic and atraumatic.  Eyes: Conjunctivae are normal. Right eye exhibits no discharge. Left eye exhibits no discharge. No scleral icterus.  Neck: Normal range of motion.  Cardiovascular: Normal rate, regular rhythm and normal heart sounds.  No murmur heard. Respiratory: Effort normal and breath sounds normal. No respiratory distress. She has no wheezes.  GI: Soft. There is no tenderness.  Genitourinary: Cervix exhibits discharge (small amount of thick white discharge). There is erythema (bilateral irritation of labia minora) in the vagina.  Neurological: She is alert and oriented to person, place, and time.  Skin: Skin is warm and dry. She is not diaphoretic.  Psychiatric: She has a normal mood and affect. Her behavior is normal. Judgment and thought content normal.   Dilation: Closed Effacement (%):  Thick Cervical Position: Posterior Station: -3 Exam by:: Judeth HornErin Braxley Balandran NP  Fetal Tracing:  Baseline: 150 Variability: moderate Accelerations: 10x10 Decelerations: none  Toco: none   MAU Course  Procedures Results for orders placed or performed during the hospital encounter of 03/09/15 (from the past 24 hour(s))  Urinalysis, Routine w reflex microscopic (not [facility])     Status: None   Collection Time: 03/09/15 10:51 PM  Result Value Ref Range   Color, Urine YELLOW YELLOW   APPearance CLEAR CLEAR   Specific Gravity, Urine 1.010 1.005 - 1.030   pH 6.0 5.0 - 8.0   Glucose, UA NEGATIVE NEGATIVE mg/dL   Hgb urine dipstick NEGATIVE NEGATIVE   Bilirubin Urine NEGATIVE NEGATIVE   Ketones, ur NEGATIVE NEGATIVE mg/dL   Protein, ur NEGATIVE NEGATIVE mg/dL   Nitrite NEGATIVE NEGATIVE   Leukocytes, UA NEGATIVE NEGATIVE  Wet prep, genital     Status: Abnormal   Collection Time: 03/09/15 11:59 PM  Result Value Ref Range   Yeast Wet Prep HPF POC NONE SEEN NONE SEEN   Trich, Wet Prep NONE SEEN NONE SEEN   Clue Cells Wet Prep HPF POC NONE SEEN NONE SEEN   WBC, Wet Prep HPF POC FEW (A) NONE SEEN   Sperm NONE SEEN     MDM Category 1 tracing No contractions traced or palpated Cervix closed 0031- S/w Dr. Marcelle OverlieHolland. Ok to discharge home.   Assessment and Plan  A: 1. Yeast infection of the vagina   2. Abdominal cramping affecting pregnancy     P: Discharge home Rx terazol Keep scheduled f/u with Dr. Marcelle OverlieHolland Discussed reasons to return  Judeth HornErin Tereka Thorley, NP  03/09/2015, 11:22 PM

## 2015-03-09 NOTE — MAU Note (Signed)
Pt reports she is having cervical pain and pressure off/on for a while , was recently treated for BV and a yeast infection abd us still having irritation and itching.

## 2015-03-10 DIAGNOSIS — O9989 Other specified diseases and conditions complicating pregnancy, childbirth and the puerperium: Secondary | ICD-10-CM

## 2015-03-10 DIAGNOSIS — O98812 Other maternal infectious and parasitic diseases complicating pregnancy, second trimester: Secondary | ICD-10-CM

## 2015-03-10 DIAGNOSIS — B373 Candidiasis of vulva and vagina: Secondary | ICD-10-CM

## 2015-03-10 DIAGNOSIS — R109 Unspecified abdominal pain: Secondary | ICD-10-CM | POA: Diagnosis not present

## 2015-03-10 LAB — WET PREP, GENITAL
Clue Cells Wet Prep HPF POC: NONE SEEN
SPERM: NONE SEEN
Trich, Wet Prep: NONE SEEN
YEAST WET PREP: NONE SEEN

## 2015-03-10 MED ORDER — TERCONAZOLE 0.8 % VA CREA: 1.0000 | TOPICAL_CREAM | Freq: Every day | VAGINAL | Status: DC

## 2015-03-10 NOTE — Discharge Instructions (Signed)
Monilial Vaginitis Vaginitis in a soreness, swelling and redness (inflammation) of the vagina and vulva. Monilial vaginitis is not a sexually transmitted infection. CAUSES  Yeast vaginitis is caused by yeast (candida) that is normally found in your vagina. With a yeast infection, the candida has overgrown in number to a point that upsets the chemical balance. SYMPTOMS   White, thick vaginal discharge.  Swelling, itching, redness and irritation of the vagina and possibly the lips of the vagina (vulva).  Burning or painful urination.  Painful intercourse. DIAGNOSIS  Things that may contribute to monilial vaginitis are:  Postmenopausal and virginal states.  Pregnancy.  Infections.  Being tired, sick or stressed, especially if you had monilial vaginitis in the past.  Diabetes. Good control will help lower the chance.  Birth control pills.  Tight fitting garments.  Using bubble bath, feminine sprays, douches or deodorant tampons.  Taking certain medications that kill germs (antibiotics).  Sporadic recurrence can occur if you become ill. TREATMENT  Your caregiver will give you medication.  There are several kinds of anti monilial vaginal creams and suppositories specific for monilial vaginitis. For recurrent yeast infections, use a suppository or cream in the vagina 2 times a week, or as directed.  Anti-monilial or steroid cream for the itching or irritation of the vulva may also be used. Get your caregiver's permission.  Painting the vagina with methylene blue solution may help if the monilial cream does not work.  Eating yogurt may help prevent monilial vaginitis. HOME CARE INSTRUCTIONS   Finish all medication as prescribed.  Do not have sex until treatment is completed or after your caregiver tells you it is okay.  Take warm sitz baths.  Do not douche.  Do not use tampons, especially scented ones.  Wear cotton underwear.  Avoid tight pants and panty  hose.  Tell your sexual partner that you have a yeast infection. They should go to their caregiver if they have symptoms such as mild rash or itching.  Your sexual partner should be treated as well if your infection is difficult to eliminate.  Practice safer sex. Use condoms.  Some vaginal medications cause latex condoms to fail. Vaginal medications that harm condoms are:  Cleocin cream.  Butoconazole (Femstat).  Terconazole (Terazol) vaginal suppository.  Miconazole (Monistat) (may be purchased over the counter). SEEK MEDICAL CARE IF:   You have a temperature by mouth above 102 F (38.9 C).  The infection is getting worse after 2 days of treatment.  The infection is not getting better after 3 days of treatment.  You develop blisters in or around your vagina.  You develop vaginal bleeding, and it is not your menstrual period.  You have pain when you urinate.  You develop intestinal problems.  You have pain with sexual intercourse.   This information is not intended to replace advice given to you by your health care provider. Make sure you discuss any questions you have with your health care provider.   Document Released: 12/06/2004 Document Revised: 05/21/2011 Document Reviewed: 08/30/2014 Elsevier Interactive Patient Education 2016 [employer] Preterm Labor Information Preterm labor is when labor starts at less than 37 weeks of pregnancy. The normal length of a pregnancy is 39 to 41 weeks. CAUSES Often, there is no identifiable underlying cause as to why a woman goes into preterm labor. One of the most common known causes of preterm labor is infection. Infections of the uterus, cervix, vagina, amniotic sac, bladder, kidney, or even the lungs (pneumonia) can cause labor  to start. Other suspected causes of preterm labor include:   Urogenital infections, such as yeast infections and bacterial vaginosis.   Uterine abnormalities (uterine shape, uterine septum,  fibroids, or bleeding from the placenta).   A cervix that has been operated on (it may fail to stay closed).   Malformations in the fetus.   Multiple gestations (twins, triplets, and so on).   Breakage of the amniotic sac.  RISK FACTORS  Having a previous history of preterm labor.   Having premature rupture of membranes (PROM).   Having a placenta that covers the opening of the cervix (placenta previa).   Having a placenta that separates from the uterus (placental abruption).   Having a cervix that is too weak to hold the fetus in the uterus (incompetent cervix).   Having too much fluid in the amniotic sac (polyhydramnios).   Taking illegal drugs or smoking while pregnant.   Not gaining enough weight while pregnant.   Being younger than 36 and older than 36 years old.   Having a low socioeconomic status.   Being African American. SYMPTOMS Signs and symptoms of preterm labor include:   Menstrual-like cramps, abdominal pain, or back pain.  Uterine contractions that are regular, as frequent as six in an hour, regardless of their intensity (may be mild or painful).  Contractions that start on the top of the uterus and spread down to the lower abdomen and back.   A sense of increased pelvic pressure.   A watery or bloody mucus discharge that comes from the vagina.  TREATMENT Depending on the length of the pregnancy and other circumstances, your health care provider may suggest bed rest. If necessary, there are medicines that can be given to stop contractions and to mature the fetal lungs. If labor happens before 34 weeks of pregnancy, a prolonged hospital stay may be recommended. Treatment depends on the condition of both you and the fetus.  WHAT SHOULD YOU DO IF YOU THINK YOU ARE IN PRETERM LABOR? Call your health care provider right away. You will need to go to the hospital to get checked immediately. HOW CAN YOU PREVENT PRETERM LABOR IN FUTURE  PREGNANCIES? You should:   Stop smoking if you smoke.  Maintain healthy weight gain and avoid chemicals and drugs that are not necessary.  Be watchful for any type of infection.  Inform your health care provider if you have a known history of preterm labor.   This information is not intended to replace advice given to you by your health care provider. Make sure you discuss any questions you have with your health care provider.   Document Released: 05/19/2003 Document Revised: 10/29/2012 Document Reviewed: 03/31/2012 Elsevier Interactive Patient Education [employer]

## 2015-03-13 NOTE — L&D Delivery Note (Signed)
Delivery Note At 8:06 PM a viable female was delivered via  (Presentation:OA ;  ).  APGAR9/9 , ; weight  .   Placenta status: Intact, Spontaneous.  Cord:  with the following complications: .  Cord pH: not sent  Anesthesia:  epid Episiotomy:  none Lacerations:  Sec deg Suture Repair: 3.0 vicryl rapide Est. Blood Loss (mL):  300  Mom to postpartum.  Baby to Couplet care / Skin to Skin.  Danyele Smejkal M 06/23/2015, 8:16 PM

## 2015-03-17 IMAGING — CT CT HEAD W/O CM
1 series · 15 of 30 positions shown, 19 images · non-contrast
Comparison: None.

CLINICAL DATA: Slipped on water in kitchen; hit head and face on
floor. Forehead pain. Initial encounter.

EXAM:
CT HEAD WITHOUT CONTRAST
TECHNIQUE: Contiguous axial images were obtained from the base of the skull
through the vertex without intravenous contrast.

[Series 2: head 4.8 h37s · axial · 0.44mm/px · z∈[-118,+38]mm · 15 of 36 slices shown, 19 images]
[im 2/36  brain]
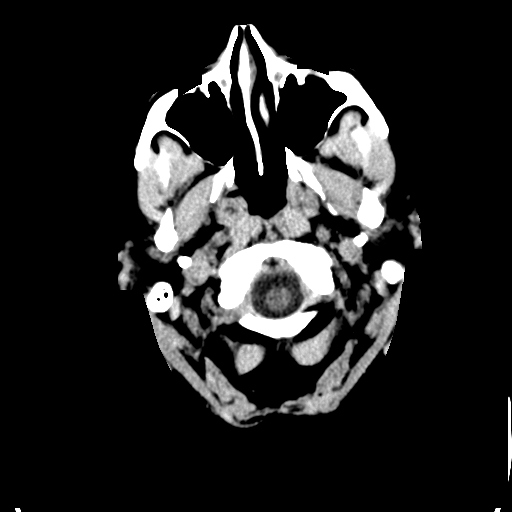
[im 2/36  bone]
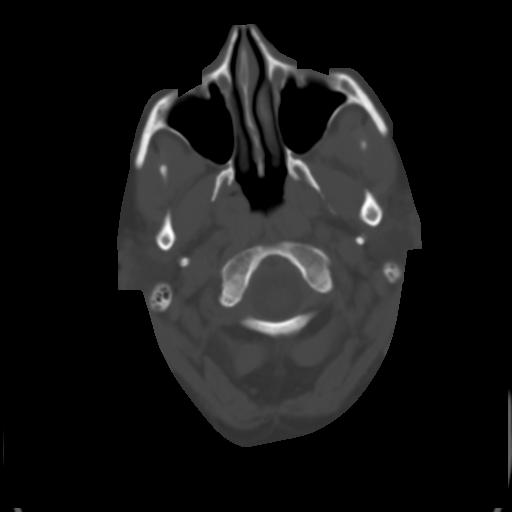
[im 4/36  brain]
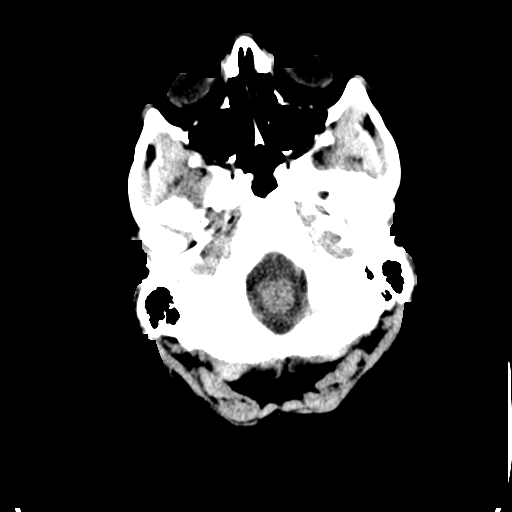
[im 7/36  brain]
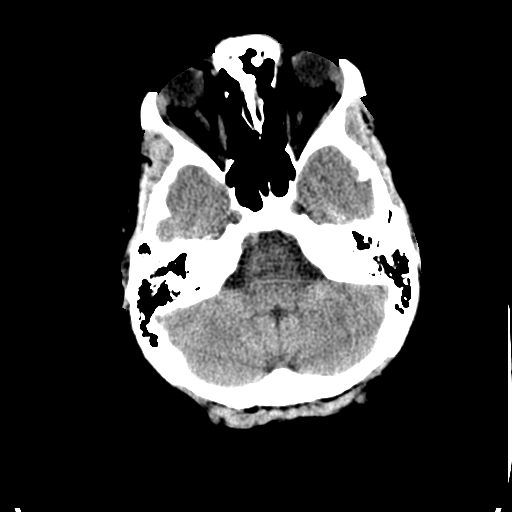
[im 9/36  brain]
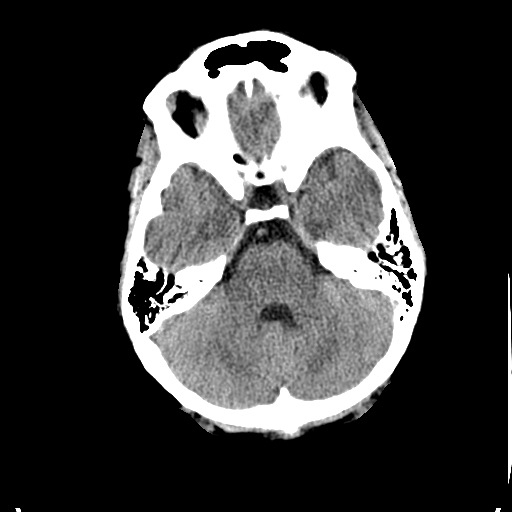
[im 11/36  brain]
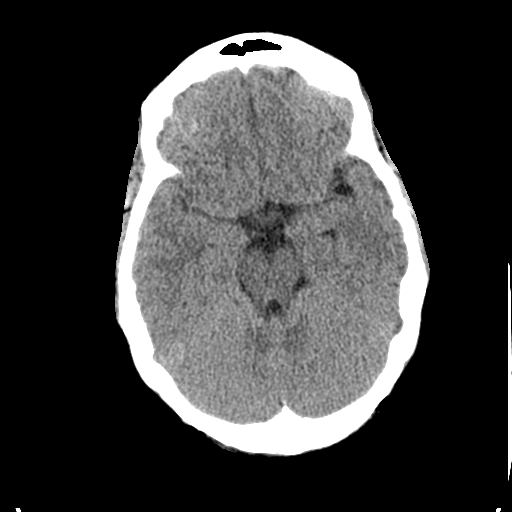
[im 11/36  bone]
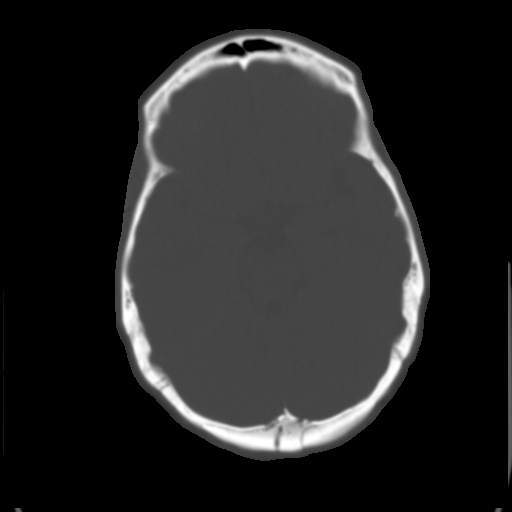
[im 14/36  brain]
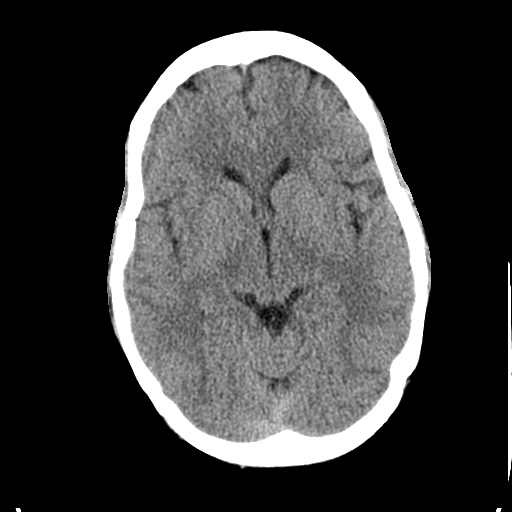
[im 16/36  brain]
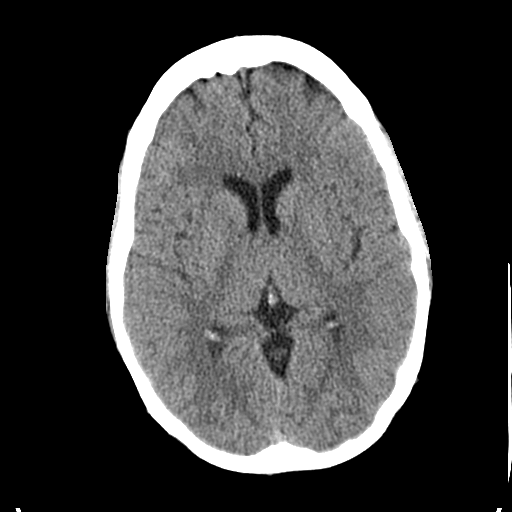
[im 19/36  brain]
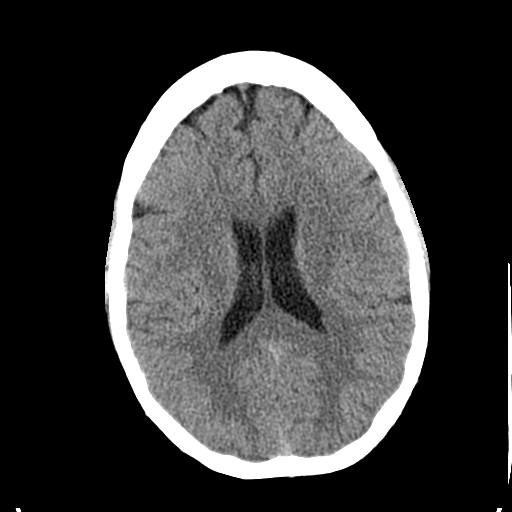
[im 20/36  brain]
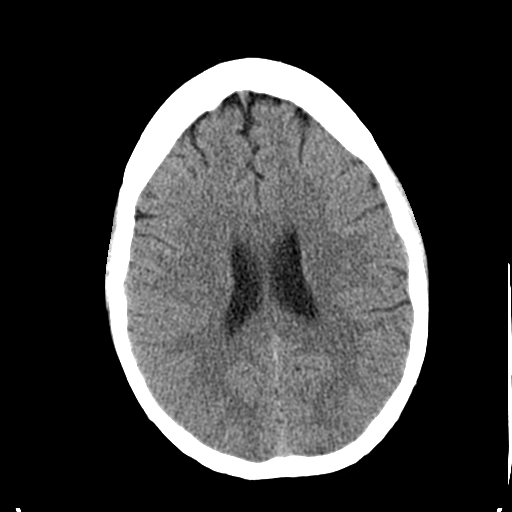
[im 20/36  bone]
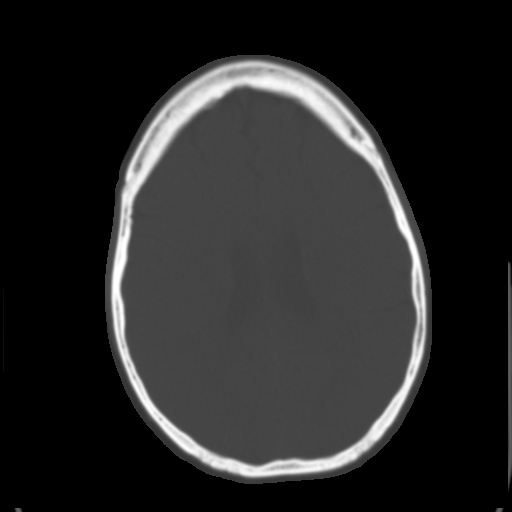
[im 22/36  brain]
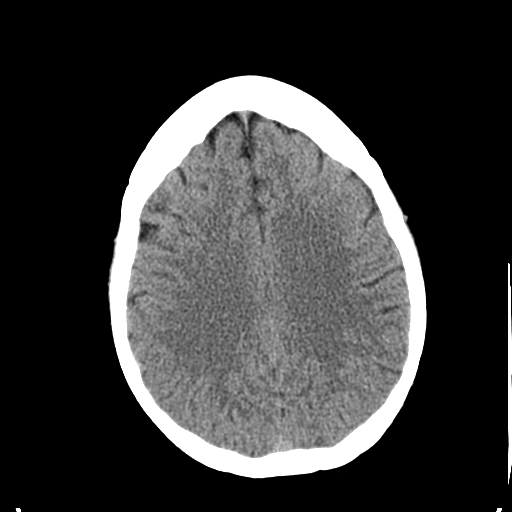
[im 25/36  brain]
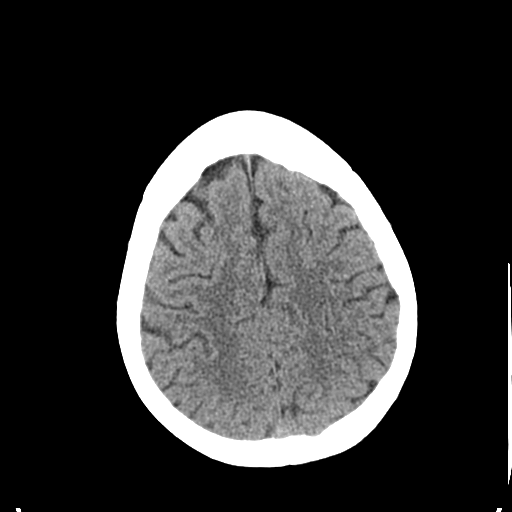
[im 27/36  brain]
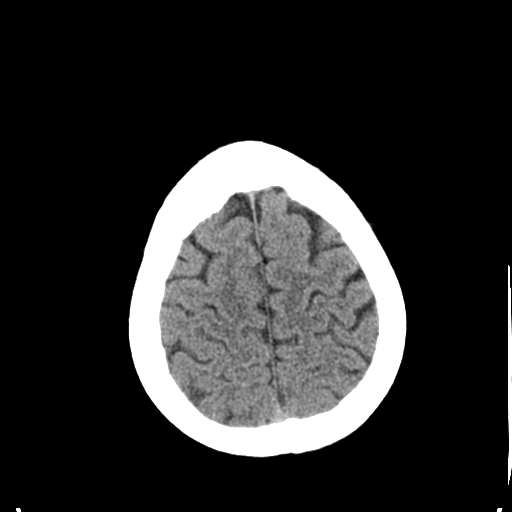
[im 29/36  brain]
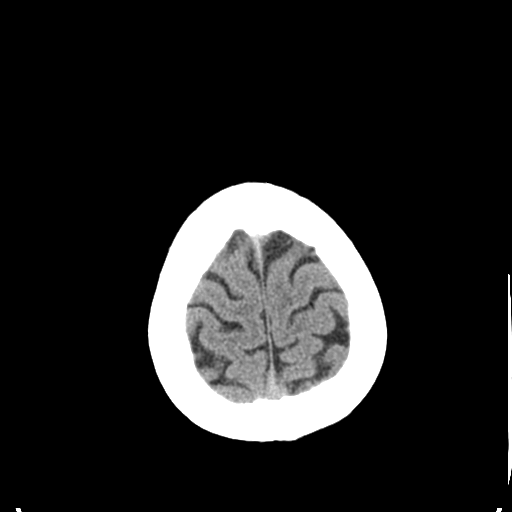
[im 29/36  bone]
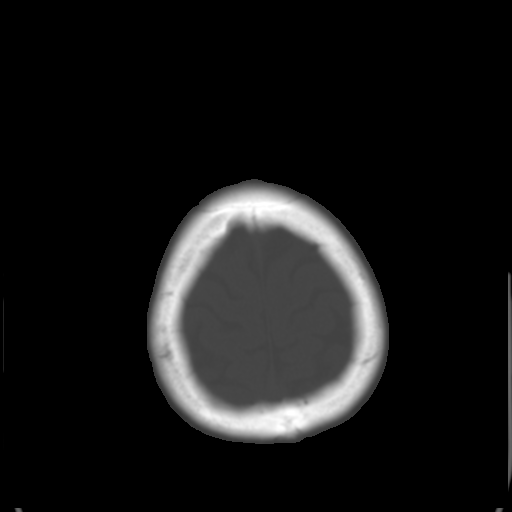
[im 32/36  brain]
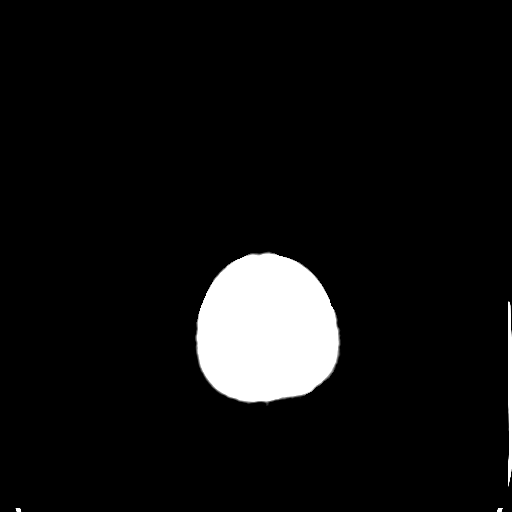
[im 34/36  brain]
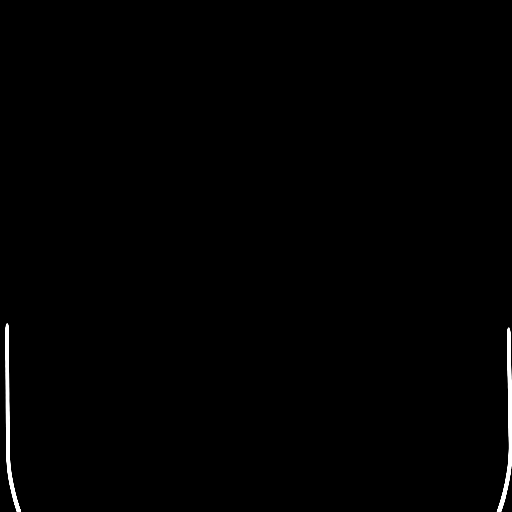

[15 of 30 positions shown; findings below may reference images not displayed]

FINDINGS: There is no evidence of acute infarction, mass lesion, or intra- or
extra-axial hemorrhage on CT.

The posterior fossa, including the cerebellum, brainstem and fourth
ventricle, is within normal limits. The third and lateral
ventricles, and basal ganglia are unremarkable in appearance. The
cerebral hemispheres are symmetric in appearance, with normal
gray-white differentiation. No mass effect or midline shift is seen.

There is no evidence of fracture; visualized osseous structures are
unremarkable in appearance. The orbits are within normal limits. The
paranasal sinuses and mastoid air cells are well-aerated. Minimal
soft tissue swelling is suggested overlying the frontal calvarium.
IMPRESSION: 1. No evidence of traumatic intracranial injury or fracture.
2. Minimal soft tissue swelling suggested overlying the frontal
calvarium.

## 2015-03-29 ENCOUNTER — Other Ambulatory Visit (HOSPITAL_COMMUNITY): Payer: Self-pay | Admitting: Obstetrics and Gynecology

## 2015-03-29 DIAGNOSIS — M7989 Other specified soft tissue disorders: Principal | ICD-10-CM

## 2015-03-29 DIAGNOSIS — M79661 Pain in right lower leg: Secondary | ICD-10-CM

## 2015-03-31 ENCOUNTER — Ambulatory Visit (HOSPITAL_COMMUNITY)
Admission: RE | Admit: 2015-03-31 | Discharge: 2015-03-31 | Disposition: A | Discharge status: Home / Self Care | Payer: BLUE CROSS/BLUE SHIELD | Source: Ambulatory Visit | Attending: Obstetrics and Gynecology | Admitting: Obstetrics and Gynecology

## 2015-03-31 DIAGNOSIS — M79661 Pain in right lower leg: Secondary | ICD-10-CM

## 2015-03-31 DIAGNOSIS — M79604 Pain in right leg: Secondary | ICD-10-CM | POA: Diagnosis present

## 2015-03-31 DIAGNOSIS — M7989 Other specified soft tissue disorders: Secondary | ICD-10-CM | POA: Diagnosis not present

## 2015-03-31 NOTE — Progress Notes (Signed)
*  PRELIMINARY RESULTS* Vascular Ultrasound Right lower extremity venous duplex has been completed.  Preliminary findings: No evidence of DVT, superficial thrombosis, or baker's cyst.  Called results to Tiffany.   Farrel Demark, RDMS, RVT  03/31/2015, 2:45 PM

## 2015-05-31 LAB — OB RESULTS CONSOLE GBS: STREP GROUP B AG: POSITIVE

## 2015-06-17 ENCOUNTER — Encounter (HOSPITAL_COMMUNITY): Payer: Self-pay | Admitting: *Deleted

## 2015-06-17 ENCOUNTER — Telehealth (HOSPITAL_COMMUNITY): Payer: Self-pay | Admitting: *Deleted

## 2015-06-17 NOTE — Telephone Encounter (Signed)
Preadmission screen  

## 2015-06-21 ENCOUNTER — Encounter (HOSPITAL_COMMUNITY): Payer: Self-pay | Admitting: *Deleted

## 2015-06-21 ENCOUNTER — Telehealth (HOSPITAL_COMMUNITY): Payer: Self-pay | Admitting: *Deleted

## 2015-06-21 NOTE — Telephone Encounter (Signed)
Preadmission screen  

## 2015-06-21 NOTE — H&P (Signed)
Imogene Burnamela Hoffman  DICTATION # [id number] CSN# [id number]   Meriel PicaHOLLAND,Kimbria Camposano M, MD 06/21/2015 1:43 PM

## 2015-06-23 ENCOUNTER — Encounter (HOSPITAL_COMMUNITY): Payer: Self-pay

## 2015-06-23 ENCOUNTER — Inpatient Hospital Stay (HOSPITAL_COMMUNITY)
Admission: RE | Admit: 2015-06-23 | Discharge: 2015-06-25 | DRG: 767 | Disposition: A | Discharge status: Home / Self Care | Payer: BLUE CROSS/BLUE SHIELD | Source: Ambulatory Visit | Attending: Obstetrics and Gynecology | Admitting: Obstetrics and Gynecology

## 2015-06-23 ENCOUNTER — Inpatient Hospital Stay (HOSPITAL_COMMUNITY): Payer: BLUE CROSS/BLUE SHIELD | Admitting: Anesthesiology

## 2015-06-23 VITALS — BP 120/56 | HR 81 | Temp 98.0°F | Resp 18 | Ht 66.0 in | Wt 247.0 lb

## 2015-06-23 DIAGNOSIS — Z3A39 39 weeks gestation of pregnancy: Secondary | ICD-10-CM

## 2015-06-23 DIAGNOSIS — O24425 Gestational diabetes mellitus in childbirth, controlled by oral hypoglycemic drugs: Principal | ICD-10-CM | POA: Diagnosis present

## 2015-06-23 DIAGNOSIS — N970 Female infertility associated with anovulation: Secondary | ICD-10-CM

## 2015-06-23 DIAGNOSIS — Z3490 Encounter for supervision of normal pregnancy, unspecified, unspecified trimester: Secondary | ICD-10-CM

## 2015-06-23 DIAGNOSIS — Z9851 Tubal ligation status: Secondary | ICD-10-CM

## 2015-06-23 DIAGNOSIS — Z302 Encounter for sterilization: Secondary | ICD-10-CM

## 2015-06-23 DIAGNOSIS — Z6841 Body Mass Index (BMI) 40.0 and over, adult: Secondary | ICD-10-CM

## 2015-06-23 DIAGNOSIS — Z6791 Unspecified blood type, Rh negative: Secondary | ICD-10-CM | POA: Diagnosis not present

## 2015-06-23 DIAGNOSIS — O99214 Obesity complicating childbirth: Secondary | ICD-10-CM | POA: Diagnosis present

## 2015-06-23 DIAGNOSIS — N915 Oligomenorrhea, unspecified: Secondary | ICD-10-CM

## 2015-06-23 DIAGNOSIS — O26893 Other specified pregnancy related conditions, third trimester: Secondary | ICD-10-CM | POA: Diagnosis present

## 2015-06-23 DIAGNOSIS — O99824 Streptococcus B carrier state complicating childbirth: Secondary | ICD-10-CM | POA: Diagnosis present

## 2015-06-23 DIAGNOSIS — E282 Polycystic ovarian syndrome: Secondary | ICD-10-CM

## 2015-06-23 DIAGNOSIS — IMO0001 Reserved for inherently not codable concepts without codable children: Secondary | ICD-10-CM

## 2015-06-23 LAB — CBC
HEMATOCRIT: 38.1 % (ref 36.0–46.0)
Hemoglobin: 13.6 g/dL (ref 12.0–15.0)
MCH: 30.4 pg (ref 26.0–34.0)
MCHC: 35.7 g/dL (ref 30.0–36.0)
MCV: 85.2 fL (ref 78.0–100.0)
Platelets: 165 10*3/uL (ref 150–400)
RBC: 4.47 MIL/uL (ref 3.87–5.11)
RDW: 14.7 % (ref 11.5–15.5)
WBC: 7.1 10*3/uL (ref 4.0–10.5)

## 2015-06-23 LAB — GLUCOSE, CAPILLARY
Glucose-Capillary: 66 mg/dL (ref 65–99)
Glucose-Capillary: 71 mg/dL (ref 65–99)

## 2015-06-23 LAB — RPR: RPR Ser Ql: NONREACTIVE

## 2015-06-23 MED ORDER — DIPHENHYDRAMINE HCL 25 MG PO CAPS: 25.0000 mg | ORAL_CAPSULE | Freq: Four times a day (QID) | ORAL | Status: DC | PRN

## 2015-06-23 MED ORDER — OXYCODONE HCL 5 MG PO TABS
10.0000 mg | ORAL_TABLET | ORAL | Status: DC | PRN
  Administered 2015-06-25: 10 mg via ORAL
  Filled 2015-06-23: qty 2

## 2015-06-23 MED ORDER — OXYCODONE HCL 5 MG PO TABS: 5.0000 mg | ORAL_TABLET | ORAL | Status: DC | PRN

## 2015-06-23 MED ORDER — ONDANSETRON HCL 4 MG/2ML IJ SOLN: 4.0000 mg | Freq: Four times a day (QID) | INTRAMUSCULAR | Status: DC | PRN

## 2015-06-23 MED ORDER — SENNOSIDES-DOCUSATE SODIUM 8.6-50 MG PO TABS
2.0000 | ORAL_TABLET | ORAL | Status: DC
  Administered 2015-06-23 – 2015-06-25 (×2): 2 via ORAL
  Filled 2015-06-23 (×2): qty 2

## 2015-06-23 MED ORDER — OXYTOCIN BOLUS FROM INFUSION: 500.0000 mL | INTRAVENOUS | Status: DC

## 2015-06-23 MED ORDER — OXYCODONE-ACETAMINOPHEN 5-325 MG PO TABS: 1.0000 | ORAL_TABLET | ORAL | Status: DC | PRN

## 2015-06-23 MED ORDER — ZOLPIDEM TARTRATE 5 MG PO TABS: 5.0000 mg | ORAL_TABLET | Freq: Every evening | ORAL | Status: DC | PRN

## 2015-06-23 MED ORDER — PRENATAL MULTIVITAMIN CH
1.0000 | ORAL_TABLET | Freq: Every day | ORAL | Status: DC
  Administered 2015-06-24: 1 via ORAL
  Filled 2015-06-23: qty 1

## 2015-06-23 MED ORDER — OXYTOCIN 10 UNIT/ML IJ SOLN
2.5000 [IU]/h | INTRAVENOUS | Status: DC
  Administered 2015-06-23 (×2): via INTRAVENOUS
  Filled 2015-06-23: qty 4

## 2015-06-23 MED ORDER — OXYTOCIN 10 UNIT/ML IJ SOLN
1.0000 m[IU]/min | INTRAVENOUS | Status: DC
  Administered 2015-06-23: 2 m[IU]/min via INTRAVENOUS

## 2015-06-23 MED ORDER — EPHEDRINE 5 MG/ML INJ
10.0000 mg | INTRAVENOUS | Status: DC | PRN
  Filled 2015-06-23: qty 2

## 2015-06-23 MED ORDER — DIPHENHYDRAMINE HCL 50 MG/ML IJ SOLN: 12.5000 mg | INTRAMUSCULAR | Status: DC | PRN

## 2015-06-23 MED ORDER — BENZOCAINE-MENTHOL 20-0.5 % EX AERO
1.0000 "application " | INHALATION_SPRAY | CUTANEOUS | Status: DC | PRN
  Administered 2015-06-23: 1 via TOPICAL
  Filled 2015-06-23: qty 56

## 2015-06-23 MED ORDER — TERBUTALINE SULFATE 1 MG/ML IJ SOLN
0.2500 mg | Freq: Once | INTRAMUSCULAR | Status: DC | PRN
  Filled 2015-06-23: qty 1

## 2015-06-23 MED ORDER — PENICILLIN G POTASSIUM 5000000 UNITS IJ SOLR
2.5000 10*6.[IU] | INTRAVENOUS | Status: DC
  Administered 2015-06-23 (×2): 2.5 10*6.[IU] via INTRAVENOUS
  Filled 2015-06-23 (×4): qty 2.5

## 2015-06-23 MED ORDER — LACTATED RINGERS IV SOLN: 500.0000 mL | Freq: Once | INTRAVENOUS | Status: DC

## 2015-06-23 MED ORDER — PENICILLIN G POTASSIUM 5000000 UNITS IJ SOLR
5.0000 10*6.[IU] | Freq: Once | INTRAMUSCULAR | Status: AC
  Administered 2015-06-23: 5 10*6.[IU] via INTRAVENOUS
  Filled 2015-06-23: qty 5

## 2015-06-23 MED ORDER — FLEET ENEMA 7-19 GM/118ML RE ENEM: 1.0000 | ENEMA | Freq: Every day | RECTAL | Status: DC | PRN

## 2015-06-23 MED ORDER — IBUPROFEN 800 MG PO TABS
800.0000 mg | ORAL_TABLET | Freq: Three times a day (TID) | ORAL | Status: DC | PRN
  Administered 2015-06-25: 800 mg via ORAL
  Filled 2015-06-23: qty 1

## 2015-06-23 MED ORDER — ACETAMINOPHEN 325 MG PO TABS
650.0000 mg | ORAL_TABLET | ORAL | Status: DC | PRN
  Administered 2015-06-24 – 2015-06-25 (×3): 650 mg via ORAL
  Filled 2015-06-23 (×4): qty 2

## 2015-06-23 MED ORDER — WITCH HAZEL-GLYCERIN EX PADS
1.0000 | MEDICATED_PAD | CUTANEOUS | Status: DC | PRN
Start: 2015-06-23 — End: 2015-06-25
  Administered 2015-06-24: 1 via TOPICAL

## 2015-06-23 MED ORDER — PHENYLEPHRINE 40 MCG/ML (10ML) SYRINGE FOR IV PUSH (FOR BLOOD PRESSURE SUPPORT)
PREFILLED_SYRINGE | INTRAVENOUS | Status: AC
  Filled 2015-06-23: qty 20

## 2015-06-23 MED ORDER — BISACODYL 10 MG RE SUPP
10.0000 mg | Freq: Every day | RECTAL | Status: DC | PRN
  Administered 2015-06-25: 10 mg via RECTAL
  Filled 2015-06-23: qty 1

## 2015-06-23 MED ORDER — PHENYLEPHRINE 40 MCG/ML (10ML) SYRINGE FOR IV PUSH (FOR BLOOD PRESSURE SUPPORT)
80.0000 ug | PREFILLED_SYRINGE | INTRAVENOUS | Status: DC | PRN
  Filled 2015-06-23: qty 2

## 2015-06-23 MED ORDER — ONDANSETRON HCL 4 MG PO TABS: 4.0000 mg | ORAL_TABLET | ORAL | Status: DC | PRN

## 2015-06-23 MED ORDER — LIDOCAINE HCL (PF) 1 % IJ SOLN
INTRAMUSCULAR | Status: DC | PRN
  Administered 2015-06-23: 4 mL
  Administered 2015-06-23: 6 mL via EPIDURAL

## 2015-06-23 MED ORDER — TETANUS-DIPHTH-ACELL PERTUSSIS 5-2.5-18.5 LF-MCG/0.5 IM SUSP: 0.5000 mL | Freq: Once | INTRAMUSCULAR | Status: DC

## 2015-06-23 MED ORDER — ACETAMINOPHEN 325 MG PO TABS: 650.0000 mg | ORAL_TABLET | ORAL | Status: DC | PRN

## 2015-06-23 MED ORDER — FENTANYL 2.5 MCG/ML BUPIVACAINE 1/10 % EPIDURAL INFUSION (WH - ANES): 14.0000 mL/h | INTRAMUSCULAR | Status: DC | PRN

## 2015-06-23 MED ORDER — LIDOCAINE HCL (PF) 1 % IJ SOLN
30.0000 mL | INTRAMUSCULAR | Status: DC | PRN
  Filled 2015-06-23: qty 30

## 2015-06-23 MED ORDER — CITRIC ACID-SODIUM CITRATE 334-500 MG/5ML PO SOLN: 30.0000 mL | ORAL | Status: DC | PRN

## 2015-06-23 MED ORDER — COCONUT OIL OIL: 1.0000 "application " | TOPICAL_OIL | Status: DC | PRN

## 2015-06-23 MED ORDER — MEASLES, MUMPS & RUBELLA VAC ~~LOC~~ INJ: 0.5000 mL | INJECTION | Freq: Once | SUBCUTANEOUS | Status: DC

## 2015-06-23 MED ORDER — LACTATED RINGERS IV SOLN
INTRAVENOUS | Status: DC
  Administered 2015-06-23: 06:00:00 via INTRAVENOUS

## 2015-06-23 MED ORDER — MISOPROSTOL 25 MCG QUARTER TABLET
25.0000 ug | ORAL_TABLET | ORAL | Status: DC | PRN
  Administered 2015-06-23: 25 ug via VAGINAL
  Filled 2015-06-23: qty 1
  Filled 2015-06-23: qty 0.25

## 2015-06-23 MED ORDER — SIMETHICONE 80 MG PO CHEW: 80.0000 mg | CHEWABLE_TABLET | ORAL | Status: DC | PRN

## 2015-06-23 MED ORDER — ONDANSETRON HCL 4 MG/2ML IJ SOLN: 4.0000 mg | INTRAMUSCULAR | Status: DC | PRN

## 2015-06-23 MED ORDER — FENTANYL 2.5 MCG/ML BUPIVACAINE 1/10 % EPIDURAL INFUSION (WH - ANES)
INTRAMUSCULAR | Status: AC
  Administered 2015-06-23: 14 mL/h via EPIDURAL
  Filled 2015-06-23: qty 125

## 2015-06-23 MED ORDER — OXYCODONE-ACETAMINOPHEN 5-325 MG PO TABS: 2.0000 | ORAL_TABLET | ORAL | Status: DC | PRN

## 2015-06-23 MED ORDER — DIBUCAINE 1 % RE OINT: 1.0000 "application " | TOPICAL_OINTMENT | RECTAL | Status: DC | PRN

## 2015-06-23 MED ORDER — LACTATED RINGERS IV SOLN: 500.0000 mL | INTRAVENOUS | Status: DC | PRN

## 2015-06-23 NOTE — Progress Notes (Signed)
Pt checked blood sugar with own machine 80 per pt.

## 2015-06-23 NOTE — Progress Notes (Signed)
Received epid, adeq labor pattern by [facility], on pit 6 mu/min, cx now 3/90/vtx -2, FHR cat I

## 2015-06-23 NOTE — Progress Notes (Signed)
Now C/C/+1, will begin pushing effort, stable FHR

## 2015-06-23 NOTE — Anesthesia Preprocedure Evaluation (Signed)
Anesthesia Evaluation  Patient identified by MRN, date of birth, ID band Patient awake    Reviewed: Allergy & Precautions, H&P , Patient's Chart, lab work & pertinent test results  Airway Mallampati: II  TM Distance: >3 FB Neck ROM: full    Dental  (+) Teeth Intact   Pulmonary  breath sounds clear to auscultation        Cardiovascular Rhythm:regular Rate:Normal     Neuro/Psych    GI/Hepatic   Endo/Other  diabetes, GestationalMorbid obesity  Renal/GU      Musculoskeletal   Abdominal   Peds  Hematology   Anesthesia Other Findings       Reproductive/Obstetrics (+) Pregnancy                             Anesthesia Physical Anesthesia Plan  ASA: III  Anesthesia Plan: Epidural   Post-op Pain Management:    Induction:   Airway Management Planned:   Additional Equipment:   Intra-op Plan:   Post-operative Plan:   Informed Consent: I have reviewed the patients History and Physical, chart, labs and discussed the procedure including the risks, benefits and alternatives for the proposed anesthesia with the patient or authorized representative who has indicated his/her understanding and acceptance.   Dental Advisory Given  Plan Discussed with:   Anesthesia Plan Comments: (Labs checked- platelets confirmed with RN in room. Fetal heart tracing, per RN, reported to be stable enough for sitting procedure. Discussed epidural, and patient consents to the procedure:  included risk of possible headache,backache, failed block, allergic reaction, and nerve injury. This patient was asked if she had any questions or concerns before the procedure started.)        Anesthesia Quick Evaluation  

## 2015-06-23 NOTE — Progress Notes (Signed)
Just arrived 0500 and received first Cytotec, ISE for AROM>>clr AF, cx 1-2/25/vtx

## 2015-06-24 LAB — CBC
HEMATOCRIT: 36.4 % (ref 36.0–46.0)
HEMOGLOBIN: 12.8 g/dL (ref 12.0–15.0)
MCH: 30.1 pg (ref 26.0–34.0)
MCHC: 35.2 g/dL (ref 30.0–36.0)
MCV: 85.6 fL (ref 78.0–100.0)
Platelets: 148 10*3/uL — ABNORMAL LOW (ref 150–400)
RBC: 4.25 MIL/uL (ref 3.87–5.11)
RDW: 14.8 % (ref 11.5–15.5)
WBC: 8.6 10*3/uL (ref 4.0–10.5)

## 2015-06-24 MED ORDER — FAMOTIDINE 20 MG PO TABS: 40.0000 mg | ORAL_TABLET | Freq: Once | ORAL | Status: DC

## 2015-06-24 MED ORDER — LACTATED RINGERS IV SOLN: INTRAVENOUS | Status: DC

## 2015-06-24 MED ORDER — METOCLOPRAMIDE HCL 10 MG PO TABS: 10.0000 mg | ORAL_TABLET | Freq: Once | ORAL | Status: DC

## 2015-06-24 NOTE — Progress Notes (Signed)
Patient is doing well. Wants. Post partum tubal ligation.  BP 103/67 mmHg  Pulse 88  Temp(Src) 98.7 F (37.1 C) (Oral)  Resp 18  Ht 5\' 6"  (1.676 m)  Wt 112.038 kg (247 lb)  BMI 39.89 kg/m2  SpO2 100%  LMP 09/22/2014  Breastfeeding? Unknown Results for orders placed or performed during the hospital encounter of 06/23/15 (from the past 24 hour(s))  CBC     Status: Abnormal   Collection Time: 06/24/15  5:31 AM  Result Value Ref Range   WBC 8.6 4.0 - 10.5 K/uL   RBC 4.25 3.87 - 5.11 MIL/uL   Hemoglobin 12.8 12.0 - 15.0 g/dL   HCT 40.936.4 81.136.0 - 91.446.0 %   MCV 85.6 78.0 - 100.0 fL   MCH 30.1 26.0 - 34.0 pg   MCHC 35.2 30.0 - 36.0 g/dL   RDW 78.214.8 95.611.5 - 21.315.5 %   Platelets 148 (L) 150 - 400 K/uL   Abdomen is soft and non tender  IMPRESSION: PPD # 1 Desires tubal ligation   PLAN: BTL tomorrow am ?0730  OR Notified

## 2015-06-24 NOTE — Lactation Note (Signed)
This note was copied from a baby's chart. Lactation Consultation Note  Patient Name: Kerri Imogene Burnamela Medina QIONG'EToday's Date: 06/24/2015 Reason for consult: Initial assessment  Visited with Mom, baby 4521 hrs old.  This is second baby, and she is choosing to pump and bottle feed at home.  Denies needing any assistance with latching.  Giving formula via curved tip syringe along with breast feeding.  Offered to set up DEBP for Mom to begin pumping now, but Mom would rather wait until she gets home.  Encouraged cue based feedings, skin to skin. Brochure left with Mom.  Informed Mom of IP Lactation services available to her.  To call for assistance prn.  Consult Status Consult Status: Follow-up Date: 06/25/15 Follow-up type: In-patient    Judee ClaraSmith, Lyvia Mondesir E 06/24/2015, 5:57 PM

## 2015-06-24 NOTE — Progress Notes (Signed)
Epidural catheter intact to back

## 2015-06-24 NOTE — Progress Notes (Signed)
MOB was referred for history of depression/anxiety. * Referral screened out by Clinical Social Worker because none of the following criteria appear to apply: ~ History of anxiety/depression during this pregnancy, or of post-partum depression. ~ Diagnosis of anxiety and/or depression within last 3 years OR * MOB's symptoms currently being treated with medication and/or therapy. Please contact the Clinical Social Worker if needs arise, or if MOB requests.   

## 2015-06-24 NOTE — H&P (Signed)
NAMImogene Burn:  Kerri [name], Kerri [name]                ACCOUNT NO.:  [id number]  MEDICAL RECORD NO.:  [id number]  LOCATION:                                 FACILITY:  PHYSICIAN:  Duke Salviaichard M. Marcelle OverlieHolland, M.D.    DATE OF BIRTH:  DATE OF ADMISSION:  06/23/2015 DATE OF DISCHARGE:                             HISTORY & PHYSICAL   CHIEF COMPLAINT:  For labor induction at term.  HPI:  A 35 year old, G2, P1-0-0-1, presents for labor induction at term. Her pregnancy has been complicated by gestational diabetes, but she has had good CBG control.  Her initial A1c was normal.  She did have GDM testing at 24 weeks.  First trimester panorama screen returned normal. She has been on glyburide 2.5 mg p.o. nightly with weekly nonstress tests that have been reactive because of some remote Zika concern, Zika IgM was checked which was negative per the CDC that was dated February 22.  She is Rh negative, did receive RhoGAM.  GBS screen was positive. Last ultrasound was April 6, that showed an EFW 8 pounds 7 ounces.  AFI that was normal and vertex with a favorable cervix, presents now for labor induction.  PAST MEDICAL HISTORY:  She is O negative.  First pregnancy in 2015, delivered a 7 pound, 9 ounce female.  She has had cryo at age 35.  For the remainder of her social and family history, please see the [facility] form for details.  PHYSICAL EXAM:  VITAL SIGNS:  Temp 98.2, blood pressure 120/82. HEENT:  Unremarkable. NECK:  Supple without masses. LUNGS:  Clear. CARDIOVASCULAR:  Regular rate and rhythm without murmurs, rubs, gallops noted. BREASTS:  Without masses. PELVIC:  40 cm fundal height.  Fetal heart rate 140.  Cervix was too soft, -3 vertex. EXTREMITIES:  Unremarkable. NEUROLOGIC:  Unremarkable.  IMPRESSION: 1. Gestational diabetes, well controlled on diet and glyburide. 2. EFW 8 pounds 7 ounces. 3. Positive GBS.  PLAN:  We will admit for IV antibiotics and two-stage labor induction. Protocol reviewed with  the patient which she understands.     Husein Guedes M. Marcelle OverlieHolland, M.D.     RMH/MEDQ  D:  06/21/2015  T:  06/21/2015  Job:  [id number]

## 2015-06-24 NOTE — Anesthesia Postprocedure Evaluation (Signed)
Anesthesia Post Note  Patient: Kerri [name]  Procedure(s) Performed: * No procedures listed *  Patient location during evaluation: Mother Baby Anesthesia Type: Epidural Level of consciousness: awake and alert and oriented Pain management: satisfactory to patient Vital Signs Assessment: post-procedure vital signs reviewed and stable Respiratory status: spontaneous breathing and nonlabored ventilation Cardiovascular status: stable Postop Assessment: no headache, no backache, no signs of nausea or vomiting, adequate PO intake and patient able to bend at knees (patient up walking) Anesthetic complications: no    Last Vitals:  Filed Vitals:   06/24/15 0332 06/24/15 0811  BP: 110/66 103/67  Pulse: 87 88  Temp: 36.8 C 37.1 C  Resp: 18 18    Last Pain:  Filed Vitals:   06/24/15 0831  PainSc: 2                  Ann Groeneveld

## 2015-06-25 ENCOUNTER — Inpatient Hospital Stay (HOSPITAL_COMMUNITY): Payer: BLUE CROSS/BLUE SHIELD | Admitting: Certified Registered Nurse Anesthetist

## 2015-06-25 ENCOUNTER — Encounter (HOSPITAL_COMMUNITY)
Admission: RE | Disposition: A | Discharge status: Home / Self Care | Payer: Self-pay | Source: Ambulatory Visit | Attending: Obstetrics & Gynecology

## 2015-06-25 ENCOUNTER — Encounter (HOSPITAL_COMMUNITY): Payer: Self-pay

## 2015-06-25 DIAGNOSIS — Z9851 Tubal ligation status: Secondary | ICD-10-CM

## 2015-06-25 HISTORY — PX: TUBAL LIGATION: SHX77

## 2015-06-25 SURGERY — LIGATION, FALLOPIAN TUBE, POSTPARTUM
Anesthesia: Epidural | Laterality: Bilateral

## 2015-06-25 MED ORDER — IBUPROFEN 800 MG PO TABS: 800.0000 mg | ORAL_TABLET | Freq: Three times a day (TID) | ORAL | Status: AC | PRN

## 2015-06-25 MED ORDER — LIDOCAINE HCL (CARDIAC) 20 MG/ML IV SOLN
INTRAVENOUS | Status: AC
  Filled 2015-06-25: qty 5

## 2015-06-25 MED ORDER — CEFAZOLIN SODIUM-DEXTROSE 2-4 GM/100ML-% IV SOLN
INTRAVENOUS | Status: AC
  Filled 2015-06-25: qty 100

## 2015-06-25 MED ORDER — FENTANYL CITRATE (PF) 100 MCG/2ML IJ SOLN
INTRAMUSCULAR | Status: DC | PRN
  Administered 2015-06-25 (×2): 25 ug via INTRAVENOUS
  Administered 2015-06-25: 50 ug via INTRAVENOUS

## 2015-06-25 MED ORDER — FENTANYL CITRATE (PF) 100 MCG/2ML IJ SOLN
25.0000 ug | INTRAMUSCULAR | Status: DC | PRN
  Administered 2015-06-25 (×2): 50 ug via INTRAVENOUS

## 2015-06-25 MED ORDER — METOCLOPRAMIDE HCL 5 MG/ML IJ SOLN: 10.0000 mg | Freq: Once | INTRAMUSCULAR | Status: DC | PRN

## 2015-06-25 MED ORDER — METOCLOPRAMIDE HCL 10 MG PO TABS: 10.0000 mg | ORAL_TABLET | Freq: Once | ORAL | Status: DC

## 2015-06-25 MED ORDER — FAMOTIDINE 20 MG PO TABS: 40.0000 mg | ORAL_TABLET | Freq: Once | ORAL | Status: DC

## 2015-06-25 MED ORDER — FENTANYL CITRATE (PF) 100 MCG/2ML IJ SOLN
INTRAMUSCULAR | Status: AC
  Filled 2015-06-25: qty 2

## 2015-06-25 MED ORDER — SODIUM BICARBONATE 8.4 % IV SOLN
INTRAVENOUS | Status: DC | PRN
  Administered 2015-06-25: 10 mL via EPIDURAL
  Administered 2015-06-25 (×2): 5 mL via EPIDURAL

## 2015-06-25 MED ORDER — MIDAZOLAM HCL 2 MG/2ML IJ SOLN
INTRAMUSCULAR | Status: DC | PRN
  Administered 2015-06-25: 1 mg via INTRAVENOUS
  Administered 2015-06-25 (×2): 0.5 mg via INTRAVENOUS

## 2015-06-25 MED ORDER — LACTATED RINGERS IV SOLN
INTRAVENOUS | Status: DC | PRN
  Administered 2015-06-25: 10:00:00 via INTRAVENOUS

## 2015-06-25 MED ORDER — PROPOFOL 10 MG/ML IV BOLUS
INTRAVENOUS | Status: DC | PRN
  Administered 2015-06-25: 5 mg via INTRAVENOUS
  Administered 2015-06-25 (×2): 10 mg via INTRAVENOUS

## 2015-06-25 MED ORDER — MIDAZOLAM HCL 2 MG/2ML IJ SOLN
INTRAMUSCULAR | Status: AC
  Filled 2015-06-25: qty 2

## 2015-06-25 MED ORDER — LACTATED RINGERS IV SOLN
INTRAVENOUS | Status: DC
Start: 2015-06-25 — End: 2015-06-25
  Administered 2015-06-25: 05:00:00 via INTRAVENOUS

## 2015-06-25 MED ORDER — PROPOFOL 10 MG/ML IV BOLUS
INTRAVENOUS | Status: AC
  Filled 2015-06-25: qty 20

## 2015-06-25 MED ORDER — MEPERIDINE HCL 25 MG/ML IJ SOLN: 6.2500 mg | INTRAMUSCULAR | Status: DC | PRN

## 2015-06-25 MED ORDER — LIDOCAINE-EPINEPHRINE (PF) 2 %-1:200000 IJ SOLN
INTRAMUSCULAR | Status: AC
  Filled 2015-06-25: qty 20

## 2015-06-25 MED ORDER — BUPIVACAINE HCL (PF) 0.25 % IJ SOLN
INTRAMUSCULAR | Status: AC
  Filled 2015-06-25: qty 30

## 2015-06-25 MED ORDER — ONDANSETRON HCL 4 MG/2ML IJ SOLN
INTRAMUSCULAR | Status: DC | PRN
  Administered 2015-06-25: 4 mg via INTRAVENOUS

## 2015-06-25 MED ORDER — SODIUM BICARBONATE 8.4 % IV SOLN
INTRAVENOUS | Status: AC
  Filled 2015-06-25: qty 50

## 2015-06-25 MED ORDER — METOCLOPRAMIDE HCL 10 MG PO TABS
10.0000 mg | ORAL_TABLET | Freq: Once | ORAL | Status: AC
  Administered 2015-06-25: 10 mg via ORAL
  Filled 2015-06-25: qty 1

## 2015-06-25 MED ORDER — BUPIVACAINE HCL (PF) 0.25 % IJ SOLN
INTRAMUSCULAR | Status: DC | PRN
  Administered 2015-06-25: 10 mL

## 2015-06-25 MED ORDER — LACTATED RINGERS IV SOLN
INTRAVENOUS | Status: DC
  Administered 2015-06-25: 12:00:00 via INTRAVENOUS

## 2015-06-25 MED ORDER — FENTANYL CITRATE (PF) 100 MCG/2ML IJ SOLN
INTRAMUSCULAR | Status: AC
  Administered 2015-06-25: 50 ug via INTRAVENOUS
  Filled 2015-06-25: qty 2

## 2015-06-25 MED ORDER — FAMOTIDINE 20 MG PO TABS
40.0000 mg | ORAL_TABLET | Freq: Once | ORAL | Status: AC
  Administered 2015-06-25: 40 mg via ORAL
  Filled 2015-06-25: qty 2

## 2015-06-25 MED ORDER — CEFAZOLIN SODIUM-DEXTROSE 2-3 GM-% IV SOLR
INTRAVENOUS | Status: DC | PRN
  Administered 2015-06-25: 2 g via INTRAVENOUS

## 2015-06-25 SURGICAL SUPPLY — 21 items
CHLORAPREP W/TINT 26ML (MISCELLANEOUS) ×3 IMPLANT
CLOTH BEACON ORANGE TIMEOUT ST (SAFETY) ×3 IMPLANT
CONTAINER PREFILL 10% NBF 15ML (MISCELLANEOUS) ×6 IMPLANT
DRSG OPSITE POSTOP 3X4 (GAUZE/BANDAGES/DRESSINGS) ×3 IMPLANT
GLOVE BIO SURGEON STRL SZ 6.5 (GLOVE) ×2 IMPLANT
GLOVE BIO SURGEONS STRL SZ 6.5 (GLOVE) ×1
GLOVE BIOGEL PI IND STRL 7.0 (GLOVE) ×1 IMPLANT
GLOVE BIOGEL PI INDICATOR 7.0 (GLOVE) ×2
GOWN STRL REUS W/TWL LRG LVL3 (GOWN DISPOSABLE) ×6 IMPLANT
NEEDLE HYPO 22GX1.5 SAFETY (NEEDLE) ×3 IMPLANT
NS IRRIG 1000ML POUR BTL (IV SOLUTION) ×3 IMPLANT
PACK ABDOMINAL MINOR (CUSTOM PROCEDURE TRAY) ×3 IMPLANT
SPONGE LAP 4X18 X RAY DECT (DISPOSABLE) ×3 IMPLANT
SUT PLAIN 0 NONE (SUTURE) ×3 IMPLANT
SUT VIC AB 2-0 CT1 (SUTURE) ×3 IMPLANT
SUT VIC AB 3-0 PS2 18 (SUTURE) ×2
SUT VIC AB 3-0 PS2 18XBRD (SUTURE) ×1 IMPLANT
SYR CONTROL 10ML LL (SYRINGE) ×3 IMPLANT
TOWEL OR 17X24 6PK STRL BLUE (TOWEL DISPOSABLE) ×6 IMPLANT
TRAY FOLEY CATH SILVER 14FR (SET/KITS/TRAYS/PACK) ×3 IMPLANT
WATER STERILE IRR 1000ML POUR (IV SOLUTION) ×3 IMPLANT

## 2015-06-25 NOTE — Anesthesia Postprocedure Evaluation (Signed)
Anesthesia Post Note  Patient: Kerri [name]  Procedure(s) Performed: Procedure(s) (LRB): POST PARTUM TUBAL LIGATION (Bilateral)  Patient location during evaluation: PACU Anesthesia Type: Epidural Level of consciousness: oriented and awake and alert Pain management: pain level controlled Vital Signs Assessment: post-procedure vital signs reviewed and stable Respiratory status: spontaneous breathing, respiratory function stable and patient connected to nasal cannula oxygen Cardiovascular status: blood pressure returned to baseline and stable Postop Assessment: no headache, no backache and epidural receding Anesthetic complications: no    Last Vitals:  Filed Vitals:   06/25/15 1145 06/25/15 1200  BP: 102/71 105/71  Pulse: 78 77  Temp:  36.6 C  Resp: 14 15    Last Pain:  Filed Vitals:   06/25/15 1205  PainSc: 2                  Phillips Groutarignan, Tiombe Tomeo

## 2015-06-25 NOTE — Progress Notes (Signed)
Patient arrived to PACU with epidural cath capped and taped to Left shoulder.  Epidural cath d/c in PACU at 1325 per Dr Acey Lavarignan, tip intact, patient tolerated procedure well.  Site unremarkable.

## 2015-06-25 NOTE — Anesthesia Preprocedure Evaluation (Signed)
Anesthesia Evaluation  Patient identified by MRN, date of birth, ID band Patient awake    Reviewed: Allergy & Precautions, NPO status , Patient's Chart, lab work & pertinent test results  Airway Mallampati: II  TM Distance: >3 FB Neck ROM: Full    Dental no notable dental hx.    Pulmonary neg pulmonary ROS,    Pulmonary exam normal breath sounds clear to auscultation       Cardiovascular negative cardio ROS Normal cardiovascular exam Rhythm:Regular Rate:Normal     Neuro/Psych negative neurological ROS  negative psych ROS   GI/Hepatic negative GI ROS, Neg liver ROS,   Endo/Other  diabetesMorbid obesity  Renal/GU negative Renal ROS  negative genitourinary   Musculoskeletal negative musculoskeletal ROS (+)   Abdominal   Peds negative pediatric ROS (+)  Hematology negative hematology ROS (+)   Anesthesia Other Findings   Reproductive/Obstetrics negative OB ROS                             Anesthesia Physical Anesthesia Plan  ASA: II  Anesthesia Plan: Epidural   Post-op Pain Management:    Induction:   Airway Management Planned: Natural Airway  Additional Equipment:   Intra-op Plan:   Post-operative Plan:   Informed Consent: I have reviewed the patients History and Physical, chart, labs and discussed the procedure including the risks, benefits and alternatives for the proposed anesthesia with the patient or authorized representative who has indicated his/her understanding and acceptance.   Dental advisory given  Plan Discussed with: CRNA  Anesthesia Plan Comments:         Anesthesia Quick Evaluation

## 2015-06-25 NOTE — Lactation Note (Signed)
This note was copied from a baby's chart. Lactation Consultation Note Mom is pumping and bottle feeding. Has new DEBP at home. Has 2 1/37 yr old at home. Mom had BTL this am. Discussed engorgement, supply and demand, I&O. Reminded of OP services.  Patient Name: Kerri [name] KVQQV'ZToday's Date: 06/25/2015 Reason for consult: Follow-up assessment   Maternal Data    Feeding Feeding Type: Formula Nipple Type: Slow - flow  LATCH Score/Interventions          Comfort (Breast/Nipple): Soft / non-tender           Lactation Tools Discussed/Used     Consult Status Consult Status: Complete Date: 06/25/15    Charyl DancerCARVER, Tammra Pressman G 06/25/2015, 2:26 PM

## 2015-06-25 NOTE — Transfer of Care (Signed)
Immediate Anesthesia Transfer of Care Note  Patient: Kerri BurnPamela Rua  Procedure(s) Performed: Procedure(s): POST PARTUM TUBAL LIGATION (Bilateral)  Patient Location: PACU  Anesthesia Type:Epidural  Level of Consciousness: awake, alert  and oriented  Airway & Oxygen Therapy: Patient Spontanous Breathing  Post-op Assessment: Report given to RN and Post -op Vital signs reviewed and stable  Post vital signs: Reviewed and stable  Last Vitals:  Filed Vitals:   06/25/15 0952 06/25/15 1057  BP: 126/79   Pulse: 90   Temp: 36.7 C 36.7 C  Resp: 19     Complications: No apparent anesthesia complications

## 2015-06-25 NOTE — Discharge Summary (Signed)
Obstetric Discharge Summary Reason for Admission: induction of labor Prenatal Procedures: none Intrapartum Procedures: nsvd Postpartum Procedures: P.P. tubal ligation Complications-Operative and Postpartum: none HEMOGLOBIN  Date Value Ref Range Status  06/24/2015 12.8 12.0 - 15.0 g/dL Final   HCT  Date Value Ref Range Status  06/24/2015 36.4 36.0 - 46.0 % Final    Physical Exam:  General: alert, cooperative and appears stated age 43Lochia: appropriate Uterine Fundus: firm Incision: healing well, no significant drainage, no dehiscence, no significant erythema DVT Evaluation: No evidence of DVT seen on physical exam.  Discharge Diagnoses: Term Pregnancy-delivered  Discharge Information: Date: 06/25/2015 Activity: pelvic rest Diet: routine Medications: Ibuprofen Condition: improved Instructions: refer to practice specific booklet Discharge to: home   Newborn Data: Live born female  Birth Weight: 7 lb 15.2 oz (3606 g) APGAR: 9,   Home with mother.  Minor Iden L 06/25/2015, 10:49 AM

## 2015-06-25 NOTE — Op Note (Signed)
NAMImogene Burn:  [name], Kerri [name]                ACCOUNT NO.:  [id number]  MEDICAL RECORD NO.:  [id number]  LOCATION:  [facility]                          FACILITY:  [facility]  PHYSICIAN:  Layah Skousen L. Sherrita Riederer, M.D.DATE OF BIRTH:  09/27/1980  DATE OF PROCEDURE:  06/25/2015 DATE OF DISCHARGE:                              OPERATIVE REPORT   PREOPERATIVE DIAGNOSIS:  Postpartum and desires permanent sterilization.  POSTOPERATIVE DIAGNOSIS:  Postpartum and desires permanent sterilization.  PROCEDURE:  Postpartum tubal ligation using a modified Pomeroy method.  SURGEON:  Khushboo Chuck L. Lathaniel Legate, MD  ANESTHESIA:  Epidural.  EBL:  Minimal.  COMPLICATIONS:  None.  PATHOLOGY:  Bilateral fallopian tube segments.  PROCEDURE IN DETAIL:  Patient was taken to the operating room.  Her epidural was dosed.  She was then prepped and draped in usual sterile fashion.  Foley catheter had been inserted.  Allis clamps were placed just beside the umbilicus, local was infiltrated, and a small supraumbilical incision was made, was carried down to the fascia. Fascia was easily opened using Mayo scissors and then opened using a hemostat.  We then identified the right fallopian tube, lifted that with a Babcock, identified the fimbriated end, placed the Babcock in the mid portion and tied off a 2 cm knuckle x2 with plain gut suture.  The knuckle was excised with Metzenbaum and the segment ends were then cauterized with the Bovie.  We then did the identical thing on the left side as well.  After the bilateral tubal ligation, hemostasis was noted. The fascia was closed using 0 Vicryl.  The skin was closed with 3-0 Vicryl.  All sponge, lap, and instrument counts were correct x2.  Each tubal segment was sent separately to Pathology.     Jarmarcus Wambold L. Vincente PoliGrewal, M.D.     Florestine AversMLG/MEDQ  D:  06/25/2015  T:  06/25/2015  Job:  [id number]

## 2015-06-25 NOTE — Anesthesia Postprocedure Evaluation (Signed)
Anesthesia Post Note  Patient: Kerri [name]  Procedure(s) Performed: Procedure(s) (LRB): POST PARTUM TUBAL LIGATION (Bilateral)  Patient location during evaluation: Mother Baby Anesthesia Type: Epidural Level of consciousness: awake Pain management: pain level controlled Vital Signs Assessment: post-procedure vital signs reviewed and stable Respiratory status: spontaneous breathing Cardiovascular status: stable Postop Assessment: no headache, no backache, epidural receding, patient able to bend at knees, no signs of nausea or vomiting and adequate PO intake Anesthetic complications: no    Last Vitals:  Filed Vitals:   06/25/15 1345 06/25/15 1355  BP:  120/56  Pulse:  81  Temp:  36.7 C  Resp: 18 18    Last Pain:  Filed Vitals:   06/25/15 1502  PainSc: 7                  Omnia Dollinger

## 2015-06-25 NOTE — Addendum Note (Signed)
Addendum  created 06/25/15 1516 by Renford DillsJanet L Jolin Benavides, CRNA   Modules edited: Clinical Notes   Clinical Notes:  File: [id number]

## 2015-06-25 NOTE — Progress Notes (Signed)
Patient doing well.  No changes in status Will proceed with pp BTL Risks reviewed with patient - risk of failure, risk of infection, anesthesia and injury to surrounding organs  Consent signed

## 2015-06-27 ENCOUNTER — Encounter (HOSPITAL_COMMUNITY): Payer: Self-pay | Admitting: Obstetrics and Gynecology

## 2015-06-27 LAB — TYPE AND SCREEN
ABO/RH(D): O NEG
Antibody Screen: POSITIVE
DAT, IGG: NEGATIVE
UNIT DIVISION: 0
UNIT DIVISION: 0

## 2015-07-02 ENCOUNTER — Encounter (HOSPITAL_COMMUNITY): Payer: Self-pay | Admitting: *Deleted

## 2015-07-02 ENCOUNTER — Inpatient Hospital Stay (HOSPITAL_COMMUNITY)
Admission: AD | Admit: 2015-07-02 | Discharge: 2015-07-02 | Disposition: A | Discharge status: Home / Self Care | Payer: BLUE CROSS/BLUE SHIELD | Source: Ambulatory Visit | Attending: Obstetrics and Gynecology | Admitting: Obstetrics and Gynecology

## 2015-07-02 DIAGNOSIS — T8131XA Disruption of external operation (surgical) wound, not elsewhere classified, initial encounter: Secondary | ICD-10-CM | POA: Insufficient documentation

## 2015-07-02 DIAGNOSIS — O9089 Other complications of the puerperium, not elsewhere classified: Secondary | ICD-10-CM | POA: Insufficient documentation

## 2015-07-02 DIAGNOSIS — O901 Disruption of perineal obstetric wound: Secondary | ICD-10-CM

## 2015-07-02 DIAGNOSIS — Y838 Other surgical procedures as the cause of abnormal reaction of the patient, or of later complication, without mention of misadventure at the time of the procedure: Secondary | ICD-10-CM | POA: Insufficient documentation

## 2015-07-02 DIAGNOSIS — S3141XA Laceration without foreign body of vagina and vulva, initial encounter: Secondary | ICD-10-CM

## 2015-07-02 NOTE — MAU Note (Signed)
Patient was discharged home after vaginal delivery last Saturday, felt a stitch pull concerned one came loose.

## 2015-07-02 NOTE — Discharge Instructions (Signed)
Vaginal Laceration °A vaginal laceration is a tear in the vaginal wall. A vaginal tear falls into one of three categories:  °1. Obstetrically related tears that occur at the time of childbirth. °2. Trauma-related tears (most often related to sexual intercourse). °3. Spontaneous tears. °Vaginal tears can cause heavy bleeding (hemorrhaging) depending on severity of the tear. Tears can be intensely tender and interfere with normal activities of living. They can make sexual intercourse painful and bring on significant burning with urination. If you have a vaginal laceration, the area around your vagina may be painful when you touch or wipe it. Even light pressure from clothing may cause some pain. Vaginal tear need to be evaluated by your caregiver.   °CAUSES  °· Obstetric-related causes, such as childbirth. °· Trauma that may result from an accident during an activity, such as sexual intercourse or a bicycle ride. °· Spontaneous causes related to aging, failed healing of a past obstetric tear, chronic irritation, or skin changes that are not well understood. °SYMPTOMS  °· Slight to heavy vaginal bleeding. °· Vaginal swelling. °· Mild to severe pain. °· Vaginal tenderness. °DIAGNOSIS  °If the tear happened during childbirth, your caregiver can diagnose the tear at that time. To diagnose a vaginal tear that happened spontaneously or because of trauma, your caregiver will perform a physical exam. During the physical exam, your caregiver may also look for any signs of trouble that may need further testing. If there is hemorrhaging, your caregiver may suggest blood tests to determine the extent of bleeding. Imaging tests may be performed, such as an ultrasonography or computed tomography (CT), to look for internal damage. A biopsy may be need if there are signs of a more serious problem.  °TREATMENT  °Treatment depends on the severity of the tear. For minor tears that heal on their own, treatment may only consist of keeping  the area clean and dry. Some tears need to be repaired with stitches. Other tears may heal on their own with help from various remedies, such as antibiotic ointments, medicated creams, or petroleum products. Depending on the circumstances, oral hormones may also be suggested. Hormone remedies may also be in the form of topical creams and vaginal tablets. For more concerning situations, hospitalization and surgical repair of the tear may be needed. °HOME CARE INSTRUCTIONS  °· Take warm-water baths that cover your hips and buttocks (sitz bath) 2 to 3 times a day. This may help any discomfort and swelling.   °· Only take over-the-counter or prescription medicines for pain, discomfort, or fever as directed by your caregiver. Do not use aspirin because it can cause increased bleeding.   °· Do not douche, use tampons, or have intercourse until your caregiver says it is okay.    °· A bandage (dressing) may have been applied. Change the dressing once a day or as directed. If the dressing sticks, soak it off with warm, soapy water.   °· Apply ice or witch hazel pads to the vagina to lessen any pain or discomfort.   °· Take a stool softener or follow a special diet as directed by your caregiver. This will help ease discomfort associated with bowel movements.   °SEEK IMMEDIATE MEDICAL CARE IF:  °· You have redness or swelling in the vaginal area.   °· You have increasing, sharp, or intense pain or tenderness in the vaginal area. °· You have pus or unusual discharge coming from the tear or vagina.   °· You notice a bad smell coming from the vagina.   °· Your tear breaks open after   it healed or was repaired.   °· You feel lightheaded. °· You have increasing abdominal pain.   °· You have an increasing or heavy amount of vaginal bleeding.   °· You have pain with intercourse after the tear heals.   °MAKE SURE YOU: °· Understand these instructions. °· Will watch your condition. °· Will get help right away if you are not doing well  or get worse. °  °This information is not intended to replace advice given to you by your health care provider. Make sure you discuss any questions you have with your health care provider. °  °Document Released: 02/26/2005 Document Revised: 11/21/2011 Document Reviewed: 07/16/2011 °Elsevier Interactive Patient Education ©2016 [employer] ° °

## 2015-07-02 NOTE — MAU Provider Note (Signed)
  History     CSN: [id number]  Arrival date and time: 07/02/15 1440   First Provider Initiated Contact with Patient 07/02/15 1546      No chief complaint on file.  HPI  Kerri [name] 35 y.o. [id number] postpartum presents to MAU because she is concerned that her vaginal stiches are coming loose  Past Medical History  Diagnosis Date  . Anxiety   . History of HPV infection 2003  . Phlebitis and thrombophlebitis   . Blood clot in vein   . Rh negative status during pregnancy   . Polycystic ovarian syndrome   . Headache(784.0)   . Vaginal Pap smear, abnormal   . Hx of varicella   . Gestational diabetes     glyburide    Past Surgical History  Procedure Laterality Date  . Gynecologic cryosurgery  2003  . Varicose vein sclerotherapy    . Tubal ligation Bilateral 06/25/2015    Procedure: POST PARTUM TUBAL LIGATION;  Surgeon: Kerri OverlieMichelle Grewal, MD;  Location: [facility];  Service: Gynecology;  Laterality: Bilateral;    Family History  Problem Relation Age of Onset  . Cancer Maternal Grandmother     lung  . Kidney disease Paternal Grandfather   . Hypertension Father   . Hypertension Brother   . Hypertension Paternal Grandmother     Social History  Substance Use Topics  . Smoking status: Never Smoker   . Smokeless tobacco: Never Used  . Alcohol Use: No    Allergies: No Known Allergies  Prescriptions prior to admission  Medication Sig Dispense Refill Last Dose  . glyBURIDE (DIABETA) 2.5 MG tablet Take 2.5 mg by mouth daily.   06/21/2015 at Unknown time  . ibuprofen (ADVIL,MOTRIN) 800 MG tablet Take 1 tablet (800 mg total) by mouth every 8 (eight) hours as needed for moderate pain. 30 tablet 0   . Prenatal Vit-Fe Fumarate-FA (PRENATAL MULTIVITAMIN) TABS tablet Take 1 tablet by mouth at bedtime.   06/21/2015 at Unknown time    Review of Systems  Genitourinary:       Vaginal stitches that are loose  All other systems reviewed and are negative.  Physical Exam   Blood pressure  120/84, pulse 79, temperature 98.4 F (36.9 C), temperature source Oral, resp. rate 18, unknown if currently breastfeeding.  Physical Exam  Nursing note and vitals reviewed. Constitutional: She is oriented to person, place, and time. She appears well-developed and well-nourished. No distress.  HENT:  Head: Normocephalic and atraumatic.  Cardiovascular: Normal rate.   Respiratory: Effort normal. No respiratory distress.  GI: Soft. There is no tenderness.  Genitourinary: Vagina normal.  Stitches intact  Musculoskeletal: Normal range of motion.  Neurological: She is alert and oriented to person, place, and time.  Skin: Skin is warm and dry.  Psychiatric: She has a normal mood and affect. Her behavior is normal. Judgment and thought content normal.    MAU Course  Procedures  MDM Perineum intact  Assessment and Plan  Vaginal laceration repair  Discharge  Kerri [name] 07/02/2015, 3:50 PM

## 2016-08-26 IMAGING — US US EXTREM LOW VENOUS*L*
1 series · 13 of 24 positions shown · non-contrast
Comparison: None.

CLINICAL DATA: Knot at the left lateral thigh, with swelling along
the left lower extremity, acute onset. Initial encounter.



[Series 1: us extrem low venous*left* · 0.09mm/px · 27 acquisitions, 13 frames shown]
[im 1/27]
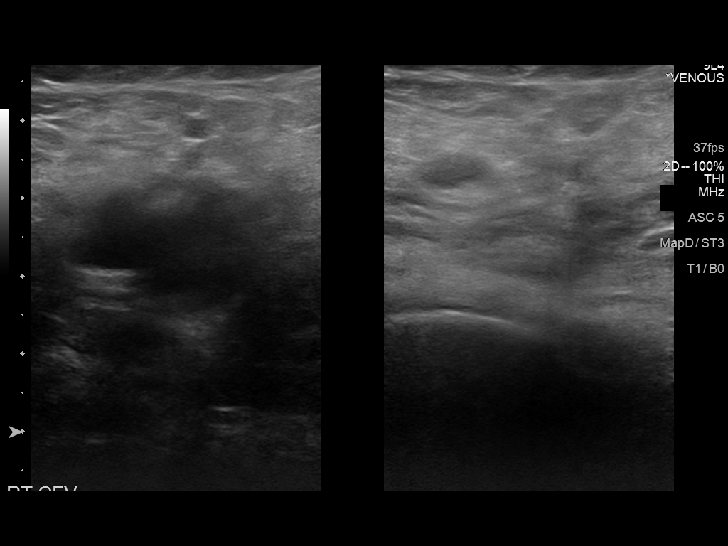
[im 3/27]
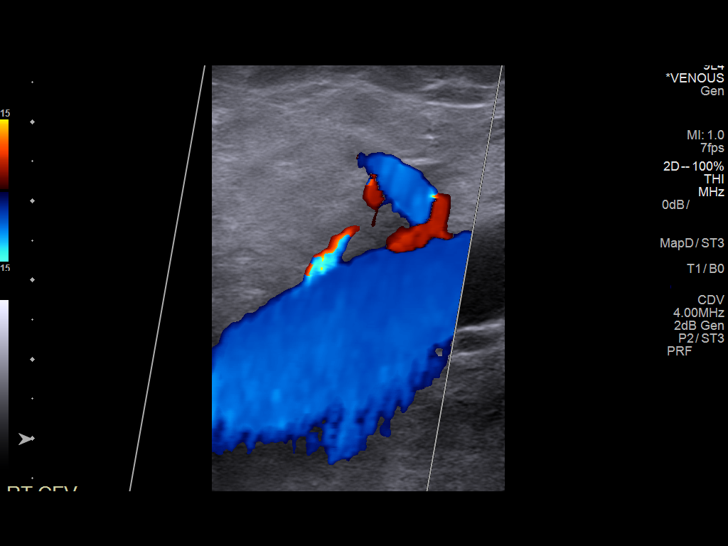
[im 5/27]
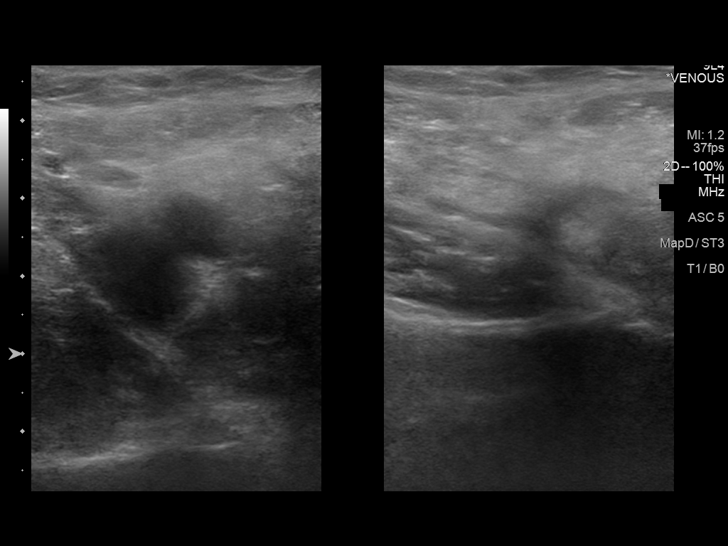
[im 8/27]
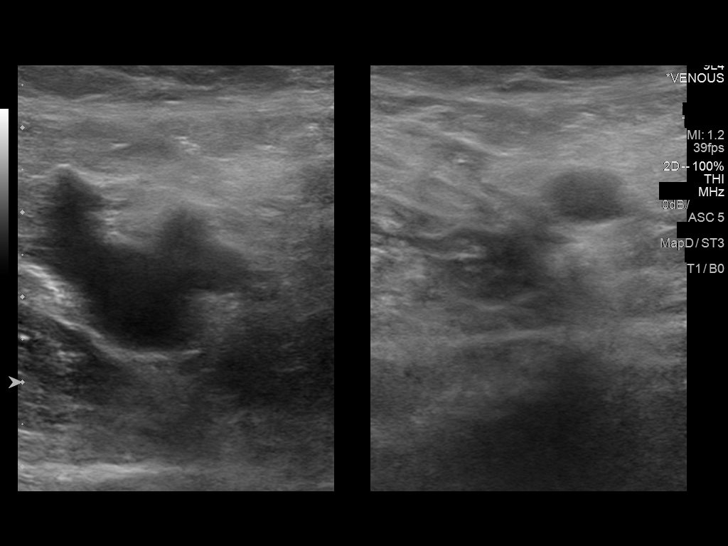
[im 11/27]
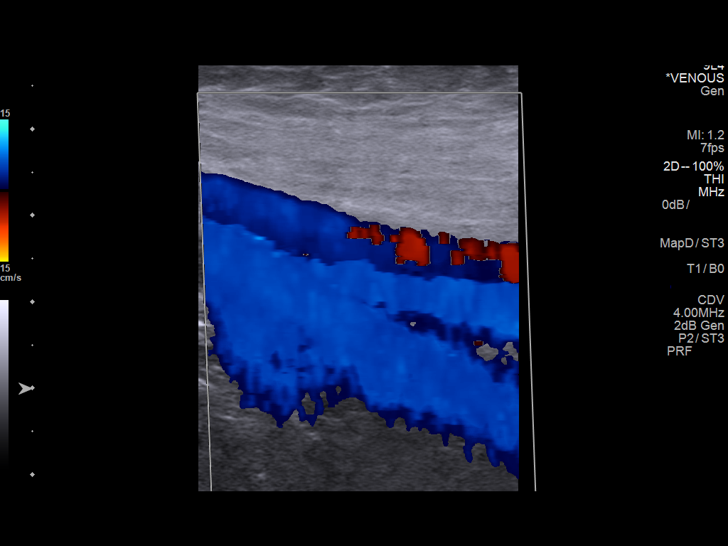
[im 13/27]
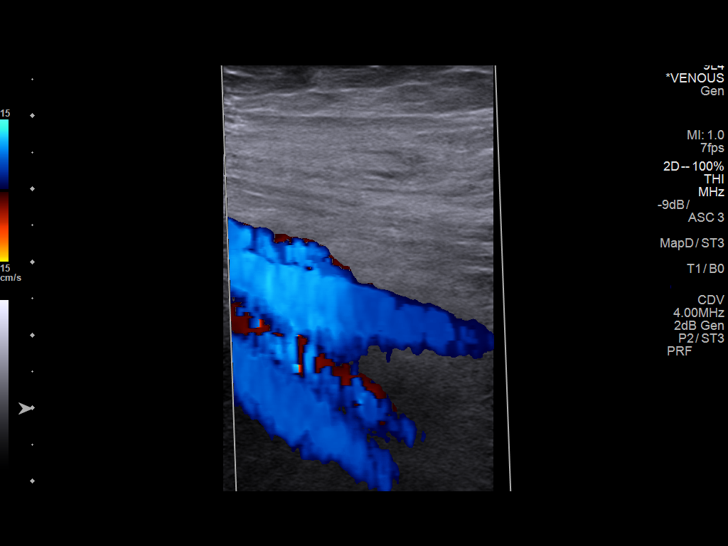
[im 15/27]
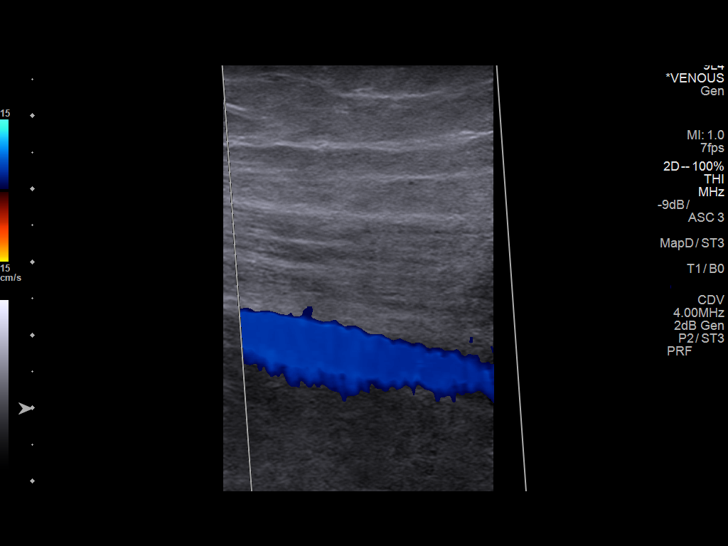
[im 16/27]
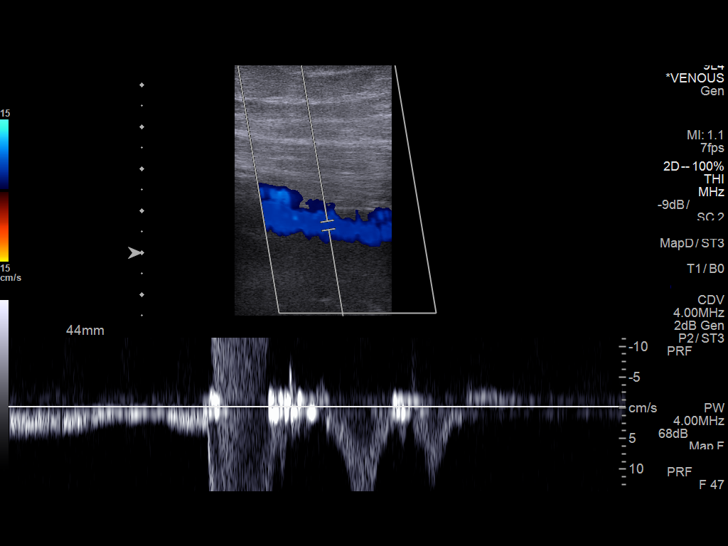
[im 19/27]
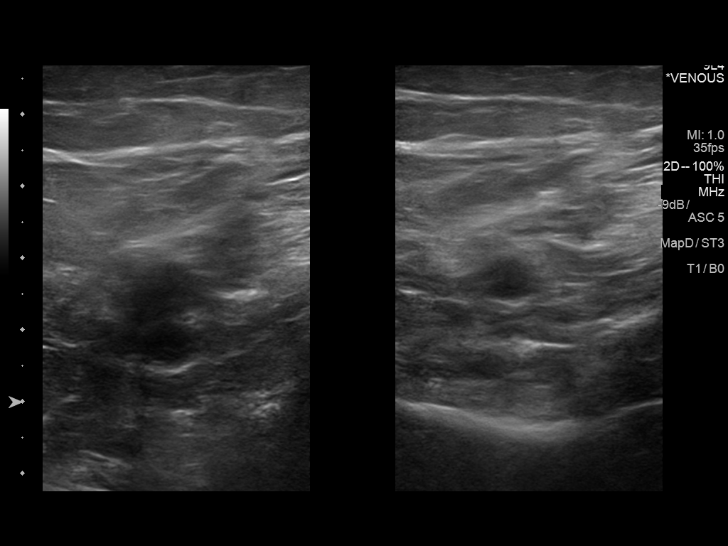
[im 21/27]
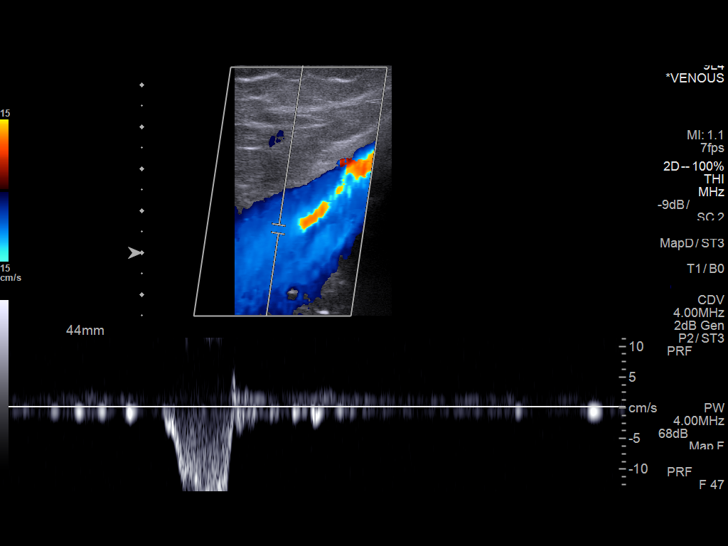
[im 24/27]
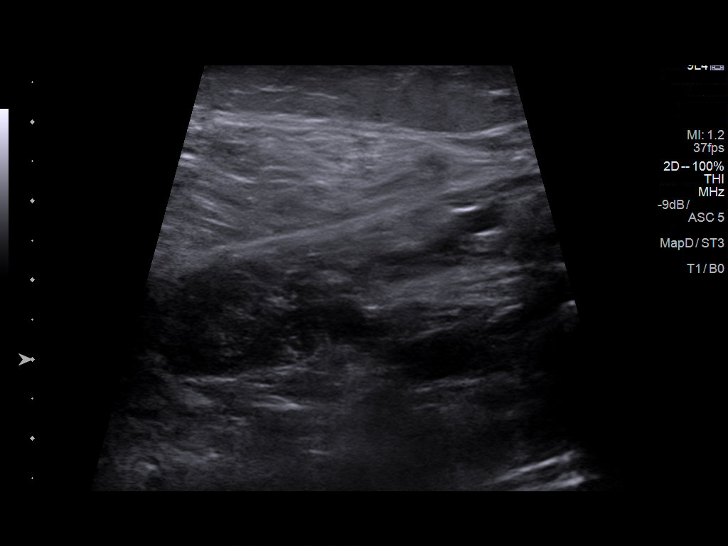
[im 25/27]
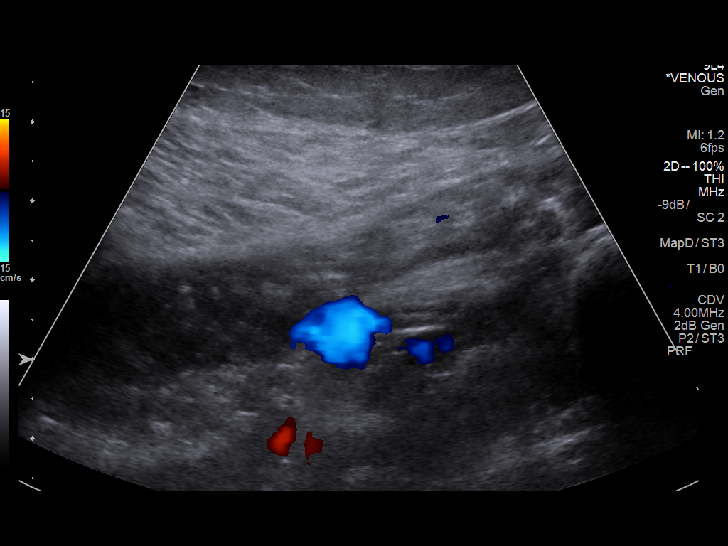
[im 27/27]
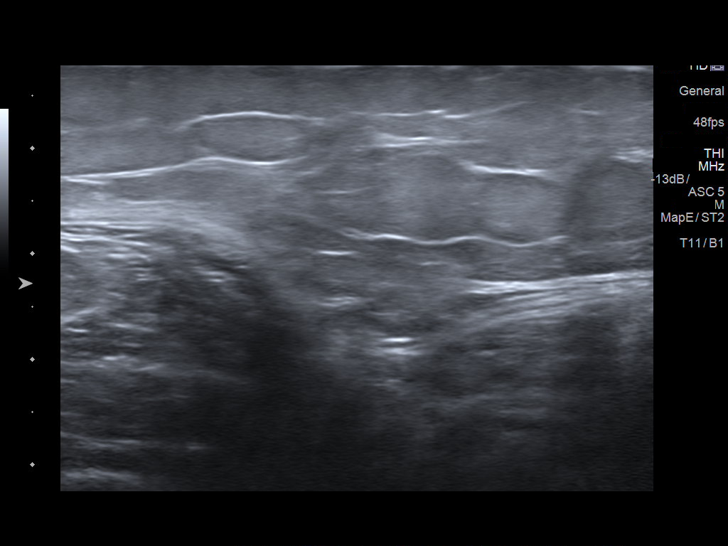

[13 of 24 positions shown; findings below may reference images not displayed]

FINDINGS: Contralateral Common Femoral Vein: Respiratory phasicity is normal
and symmetric with the symptomatic side. No evidence of thrombus.
Normal compressibility.

Common Femoral Vein: No evidence of thrombus. Normal
compressibility, respiratory phasicity and response to augmentation.

Saphenofemoral Junction: No evidence of thrombus. Normal
compressibility and flow on color Doppler imaging.

Profunda Femoral Vein: No evidence of thrombus. Normal
compressibility and flow on color Doppler imaging.

Femoral Vein: No evidence of thrombus. Normal compressibility,
respiratory phasicity and response to augmentation.

Popliteal Vein: No evidence of thrombus. Normal compressibility,
respiratory phasicity and response to augmentation.

Calf Veins: No evidence of thrombus. Normal compressibility and flow
on color Doppler imaging.

Superficial Great Saphenous Vein: No evidence of thrombus. Normal
compressibility and flow on color Doppler imaging.

Venous Reflux:  None.

Other Findings:  None.
IMPRESSION: No evidence of deep venous thrombosis.

## 2021-04-03 ENCOUNTER — Other Ambulatory Visit: Payer: Self-pay | Admitting: Obstetrics and Gynecology

## 2021-04-03 DIAGNOSIS — R928 Other abnormal and inconclusive findings on diagnostic imaging of breast: Secondary | ICD-10-CM

## 2021-05-03 ENCOUNTER — Other Ambulatory Visit: Payer: BLUE CROSS/BLUE SHIELD
